# Patient Record
Sex: Male | Born: 1950 | Race: White | Hispanic: No | Marital: Married | Smoking: Former smoker
Health system: Southern US, Community
[De-identification: ages and names within clinical notes are randomized; demographics above are authoritative.]

## PROBLEM LIST (undated history)

## (undated) DIAGNOSIS — C4490 Unspecified malignant neoplasm of skin, unspecified: Secondary | ICD-10-CM

## (undated) DIAGNOSIS — K625 Hemorrhage of anus and rectum: Secondary | ICD-10-CM

## (undated) DIAGNOSIS — Z8371 Family history of colonic polyps: Secondary | ICD-10-CM

## (undated) DIAGNOSIS — N189 Chronic kidney disease, unspecified: Secondary | ICD-10-CM

## (undated) DIAGNOSIS — E785 Hyperlipidemia, unspecified: Secondary | ICD-10-CM

## (undated) DIAGNOSIS — E119 Type 2 diabetes mellitus without complications: Secondary | ICD-10-CM

## (undated) HISTORY — DX: Family history of colonic polyps: Z83.71

## (undated) HISTORY — DX: Chronic kidney disease, unspecified: N18.9

## (undated) HISTORY — DX: Hyperlipidemia, unspecified: E78.5

## (undated) HISTORY — DX: Hemorrhage of anus and rectum: K62.5

## (undated) HISTORY — DX: Unspecified malignant neoplasm of skin, unspecified: C44.90

---

## 2006-05-24 HISTORY — PX: COLONOSCOPY: SHX174

## 2011-07-02 ENCOUNTER — Ambulatory Visit: Payer: Self-pay | Admitting: General Surgery

## 2011-07-05 LAB — PATHOLOGY REPORT

## 2012-12-05 ENCOUNTER — Encounter: Payer: Self-pay | Admitting: *Deleted

## 2014-06-25 ENCOUNTER — Ambulatory Visit: Payer: Self-pay | Admitting: General Surgery

## 2014-07-21 ENCOUNTER — Ambulatory Visit: Payer: Self-pay | Admitting: Physician Assistant

## 2014-07-23 ENCOUNTER — Encounter: Payer: Self-pay | Admitting: *Deleted

## 2014-08-03 ENCOUNTER — Ambulatory Visit: Payer: Self-pay | Admitting: Family Medicine

## 2014-09-11 ENCOUNTER — Ambulatory Visit
Admit: 2014-09-11 | Disposition: A | Discharge status: Home / Self Care | Payer: Self-pay | Attending: Specialist | Admitting: Specialist

## 2016-09-28 ENCOUNTER — Other Ambulatory Visit: Payer: Self-pay | Admitting: Internal Medicine

## 2016-09-28 DIAGNOSIS — R109 Unspecified abdominal pain: Secondary | ICD-10-CM

## 2016-09-28 DIAGNOSIS — R634 Abnormal weight loss: Secondary | ICD-10-CM

## 2016-10-05 ENCOUNTER — Ambulatory Visit
Admission: RE | Admit: 2016-10-05 | Discharge: 2016-10-05 | Disposition: A | Discharge status: Home / Self Care | Payer: Federal, State, Local not specified - PPO | Source: Ambulatory Visit | Attending: Internal Medicine | Admitting: Internal Medicine

## 2016-10-05 DIAGNOSIS — R634 Abnormal weight loss: Secondary | ICD-10-CM

## 2016-10-05 DIAGNOSIS — R109 Unspecified abdominal pain: Secondary | ICD-10-CM | POA: Insufficient documentation

## 2016-10-05 HISTORY — DX: Type 2 diabetes mellitus without complications: E11.9

## 2016-10-05 MED ORDER — IOPAMIDOL (ISOVUE-300) INJECTION 61%
100.0000 mL | Freq: Once | INTRAVENOUS | Status: AC | PRN
  Administered 2016-10-05: 100 mL via INTRAVENOUS

## 2016-10-07 ENCOUNTER — Other Ambulatory Visit: Payer: Self-pay | Admitting: Internal Medicine

## 2016-10-07 DIAGNOSIS — R1907 Generalized intra-abdominal and pelvic swelling, mass and lump: Secondary | ICD-10-CM

## 2016-10-12 ENCOUNTER — Other Ambulatory Visit: Payer: Self-pay | Admitting: General Surgery

## 2016-10-13 ENCOUNTER — Ambulatory Visit: Payer: Federal, State, Local not specified - PPO

## 2016-10-13 ENCOUNTER — Ambulatory Visit
Admission: RE | Admit: 2016-10-13 | Discharge: 2016-10-13 | Disposition: A | Discharge status: Home / Self Care | Payer: Federal, State, Local not specified - PPO | Source: Ambulatory Visit | Attending: Internal Medicine | Admitting: Internal Medicine

## 2016-10-13 DIAGNOSIS — R1907 Generalized intra-abdominal and pelvic swelling, mass and lump: Secondary | ICD-10-CM

## 2016-10-13 MED ORDER — FENTANYL CITRATE (PF) 100 MCG/2ML IJ SOLN
INTRAMUSCULAR | Status: AC
  Filled 2016-10-13: qty 4

## 2016-10-13 MED ORDER — SODIUM CHLORIDE 0.9 % IV SOLN: INTRAVENOUS | Status: DC

## 2016-10-13 MED ORDER — MIDAZOLAM HCL 5 MG/5ML IJ SOLN
INTRAMUSCULAR | Status: AC
  Filled 2016-10-13: qty 5

## 2016-10-15 ENCOUNTER — Other Ambulatory Visit: Payer: Self-pay | Admitting: General Surgery

## 2016-10-19 ENCOUNTER — Ambulatory Visit
Admission: RE | Admit: 2016-10-19 | Discharge: 2016-10-19 | Disposition: A | Discharge status: Home / Self Care | Payer: Federal, State, Local not specified - PPO | Source: Ambulatory Visit | Attending: Internal Medicine | Admitting: Internal Medicine

## 2016-10-19 ENCOUNTER — Ambulatory Visit: Payer: Federal, State, Local not specified - PPO

## 2016-10-19 ENCOUNTER — Other Ambulatory Visit: Payer: Self-pay | Admitting: Internal Medicine

## 2016-10-19 DIAGNOSIS — R634 Abnormal weight loss: Secondary | ICD-10-CM | POA: Insufficient documentation

## 2016-10-19 DIAGNOSIS — E785 Hyperlipidemia, unspecified: Secondary | ICD-10-CM | POA: Insufficient documentation

## 2016-10-19 DIAGNOSIS — R1900 Intra-abdominal and pelvic swelling, mass and lump, unspecified site: Secondary | ICD-10-CM | POA: Diagnosis present

## 2016-10-19 DIAGNOSIS — Z6825 Body mass index (BMI) 25.0-25.9, adult: Secondary | ICD-10-CM | POA: Diagnosis not present

## 2016-10-19 DIAGNOSIS — E119 Type 2 diabetes mellitus without complications: Secondary | ICD-10-CM | POA: Insufficient documentation

## 2016-10-19 DIAGNOSIS — C8510 Unspecified B-cell lymphoma, unspecified site: Secondary | ICD-10-CM | POA: Insufficient documentation

## 2016-10-19 DIAGNOSIS — M1990 Unspecified osteoarthritis, unspecified site: Secondary | ICD-10-CM | POA: Insufficient documentation

## 2016-10-19 DIAGNOSIS — Z87891 Personal history of nicotine dependence: Secondary | ICD-10-CM | POA: Insufficient documentation

## 2016-10-19 DIAGNOSIS — R1907 Generalized intra-abdominal and pelvic swelling, mass and lump: Secondary | ICD-10-CM

## 2016-10-19 LAB — CBC WITH DIFFERENTIAL/PLATELET
Basophils Absolute: 0 10*3/uL (ref 0–0.1)
Basophils Relative: 0 %
EOS ABS: 0.4 10*3/uL (ref 0–0.7)
EOS PCT: 4 %
HEMATOCRIT: 41.2 % (ref 40.0–52.0)
Hemoglobin: 14.1 g/dL (ref 13.0–18.0)
Lymphocytes Relative: 7 %
Lymphs Abs: 0.8 10*3/uL — ABNORMAL LOW (ref 1.0–3.6)
MCH: 30.3 pg (ref 26.0–34.0)
MCHC: 34.3 g/dL (ref 32.0–36.0)
MCV: 88.6 fL (ref 80.0–100.0)
MONO ABS: 1.5 10*3/uL — AB (ref 0.2–1.0)
MONOS PCT: 14 %
Neutro Abs: 7.6 10*3/uL — ABNORMAL HIGH (ref 1.4–6.5)
Neutrophils Relative %: 75 %
Platelets: 185 10*3/uL (ref 150–440)
RBC: 4.65 MIL/uL (ref 4.40–5.90)
RDW: 13.3 % (ref 11.5–14.5)
WBC: 10.3 10*3/uL (ref 3.8–10.6)

## 2016-10-19 LAB — PROTIME-INR
INR: 1.07
PROTHROMBIN TIME: 13.9 s (ref 11.4–15.2)

## 2016-10-19 LAB — APTT: APTT: 27 s (ref 24–36)

## 2016-10-19 MED ORDER — FENTANYL CITRATE (PF) 100 MCG/2ML IJ SOLN
INTRAMUSCULAR | Status: AC | PRN
  Administered 2016-10-19: 50 ug via INTRAVENOUS

## 2016-10-19 MED ORDER — MIDAZOLAM HCL 2 MG/2ML IJ SOLN
INTRAMUSCULAR | Status: AC | PRN
  Administered 2016-10-19: 1 mg via INTRAVENOUS

## 2016-10-19 MED ORDER — SODIUM CHLORIDE 0.9 % IV SOLN
INTRAVENOUS | Status: DC
  Administered 2016-10-19 (×2): via INTRAVENOUS

## 2016-10-19 MED ORDER — OXYCODONE HCL 5 MG PO TABS
10.0000 mg | ORAL_TABLET | Freq: Once | ORAL | Status: AC
  Administered 2016-10-19: 10 mg via ORAL
  Filled 2016-10-19: qty 2

## 2016-10-19 NOTE — Discharge Instructions (Signed)
Needle Biopsy, Care After °Refer to this sheet in the next few weeks. These instructions provide you with information about caring for yourself after your procedure. Your health care provider may also give you more specific instructions. Your treatment has been planned according to current medical practices, but problems sometimes occur. Call your health care provider if you have any problems or questions after your procedure. °What can I expect after the procedure? °After your procedure, it is common to have soreness, bruising, or mild pain at the biopsy site. This should go away in a few days. °Follow these instructions at home: °· Rest as directed by your health care provider. °· Take medicines only as directed by your health care provider. °· There are many different ways to close and cover the biopsy site, including stitches (sutures), skin glue, and adhesive strips. Follow your health care provider's instructions about: °¨ Biopsy site care. °¨ Bandage (dressing) changes and removal. °¨ Biopsy site closure removal. °· Check your biopsy site every day for signs of infection. Watch for: °¨ Redness, swelling, or pain. °¨ Fluid, blood, or pus. °Contact a health care provider if: °· You have a fever. °· You have redness, swelling, or pain at the biopsy site that lasts longer than a few days. °· You have fluid, blood, or pus coming from the biopsy site. °· You feel nauseous. °· You vomit. °Get help right away if: °· You have shortness of breath. °· You have trouble breathing. °· You have chest pain. °· You feel dizzy or you faint. °· You have bleeding that does not stop with pressure or a bandage. °· You cough up blood. °· You have pain in your abdomen. °This information is not intended to replace advice given to you by your health care provider. Make sure you discuss any questions you have with your health care provider. °Document Released: 09/24/2014 Document Revised: 10/16/2015 Document Reviewed:  05/06/2014 °Elsevier Interactive Patient Education © 2017 [employer] ° °

## 2016-10-19 NOTE — Procedures (Signed)
L retroperitoneal mass 18 g core times four EBL 0 Comp 0

## 2016-10-19 NOTE — H&P (Signed)
Chief Complaint: Patient was seen in consultation today for No chief complaint on file.  at the request of Albina Billet  Referring Physician(s): Albina Billet  Supervising Physician: Marybelle Killings  Patient Status: [facility] - Out-pt  History of Present Illness: Gregory Irwin is a 66 y.o. male with a history of diabetes mellitus and seronegative arthritis who presented with a 50 pound weight loss and abdominal pain with bloating. A CT demonstrated a large left retroperitoneal mass invading the left kidney. He is sent for biopsy.  Past Medical History:  Diagnosis Date  . Diabetes mellitus without complication ([place])   . Hemorrhage of rectum and anus   . Hyperlipidemia     Past Surgical History:  Procedure Laterality Date  . COLONOSCOPY  2008    Allergies: Patient has no known allergies.  Medications: Prior to Admission medications   Not on File     No family history on file.  Social History   Social History  . Marital status: Married    Spouse name: N/A  . Number of children: N/A  . Years of education: N/A   Social History Main Topics  . Smoking status: Former Smoker    Packs/day: 1.00    Years: 25.00  . Smokeless tobacco: Never Used  . Alcohol use Yes  . Drug use: No  . Sexual activity: Not on file   Other Topics Concern  . Not on file   Social History Narrative  . No narrative on file      Review of Systems: A 12 point ROS discussed and pertinent positives are indicated in the HPI above.  All other systems are negative.  Review of Systems  Vital Signs: BP 135/80   Pulse (!) 112   Temp 98.1 F (36.7 C) (Oral)   Resp 20   Ht 6\' 2"  (1.88 m)   Wt 200 lb (90.7 kg)   SpO2 98%   BMI 25.68 kg/m   Physical Exam  Constitutional: He is oriented to person, place, and time. He appears well-developed and well-nourished.  Cardiovascular: Normal rate and regular rhythm.   Pulmonary/Chest: Effort normal and breath sounds normal.  Neurological: He  is alert and oriented to person, place, and time.    Mallampati Score:   2  Imaging: Ct Abdomen Pelvis W Contrast  Addendum Date: 10/05/2016   ADDENDUM REPORT: 10/05/2016 16:33 ADDENDUM: Findings were called to Dr. Hall Busing. Dr. Hall Busing will consult Interventional Radiology for a CT guided biopsy of the mass. Electronically Signed   By: Dorise Bullion III M.D   On: 10/05/2016 16:33   Result Date: 10/05/2016 CLINICAL DATA:  Upper abdominal pain for 5 weeks. 40 pounds of weight loss. EXAM: CT ABDOMEN AND PELVIS WITH CONTRAST TECHNIQUE: Multidetector CT imaging of the abdomen and pelvis was performed using the standard protocol following bolus administration of intravenous contrast. CONTRAST:  125mL ISOVUE-300 IOPAMIDOL (ISOVUE-300) INJECTION 61% COMPARISON:  None. FINDINGS: Lower chest: No acute abnormality. Hepatobiliary: Hepatic steatosis identified. No focal liver masses are noted. The gallbladder is normal. The portal vein is patent. Pancreas: The pancreas is poorly evaluated due to a large mass in the abdomen which will be described below. The pancreatic head and neck however appear grossly unremarkable. Spleen: No splenic masses are identified. Adrenals/Urinary Tract: The right adrenal gland is normal. The left adrenal gland is not well evaluated due to the abdominal mass which encases at. The left kidney is encased and invaded by the abnormal soft tissue with mild hydronephrosis  superiorly. The left ureter is encased a as well. The left renal vein is displaced anteriorly. There is a rind of abnormal soft tissue around the right kidney but the right kidney itself is normal in appearance. No right-sided hydronephrosis or perinephric stranding. The right ureter is normal. The bladder is unremarkable. Stomach/Bowel: The large mass in the abdomen abuts the stomach. Findings are suspicious for gastric involvement with apparent thickening of the lesser curvature. The GE junction is encased as well. The proximal  duodenum is also encased. There is no bowel obstruction. The remainder of the small bowel is normal. The colon is normal in appearance. No suspicious mass or obstruction. The appendix is grossly unremarkable. Vascular/Lymphatic: Atherosclerosis is seen in the non aneurysmal aorta and iliac vessels. The abnormal soft tissue mass in the abdomen encases portions of the aorta and some of the arising branches. The celiac and superior mesenteric arteries remain widely patent as does the right renal artery. There is narrowing of the left renal artery which is completely encased. Reproductive: Prostate is unremarkable. Other: There is ascites in the abdomen. There is a large soft tissue mass which is centered in the central abdomen with apparent involvement of the stomach. The mass also encases and invades the left kidney. The mass measures at least 23 x 15 cm on axial image 28. The mass narrows the splenic vein, left renal vein, and left renal artery. There is adenopathy in the left retrocrural region and throughout the retroperitoneum, particularly on the left but also to the right of midline. Abnormal nodes in the gastrohepatic ligament and porta hepatis. Nodes in the pelvis appear to be spared. Musculoskeletal: Sclerosis in the left side of the sacrum on series 3, image 74 is nonspecific and could be a bone island. No definitive bony involvement. IMPRESSION: 1. There is a large mass centered in the upper abdomen with probable involvement of the stomach an definitive involvement of the left kidney. The pancreatic body and tail are also encased. The mass encases multiple artery's and veins with narrowing of the left splenic vein, left renal vein, and left renal artery. There is adenopathy in the retroperitoneum, particularly on the left. There is nodularity in the fat anterior to the liver on image 13 and in an epicardial node on image 15. These findings are not completely specific but lymphoma is considered the most  likely diagnosis. Other primary carcinomas, including pancreatic or renal carcinomas are considered less likely. Recommend correlation with tissue sampling. The tissue may be easiest to sample through the stomach via endoscopic ultrasound. Findings are being called to the referring clinical team. Electronically Signed: By: Dorise Bullion III M.D On: 10/05/2016 16:13    Labs:  CBC:  Recent Labs  10/19/16 0817  WBC 10.3  HGB 14.1  HCT 41.2  PLT 185    COAGS:  Recent Labs  10/19/16 0817  INR 1.07  APTT 27    BMP: No results for input(s): NA, K, CL, CO2, GLUCOSE, BUN, CALCIUM, CREATININE, GFRNONAA, GFRAA in the last 8760 hours.  Invalid input(s): CMP  LIVER FUNCTION TESTS: No results for input(s): BILITOT, AST, ALT, ALKPHOS, PROT, ALBUMIN in the last 8760 hours.  TUMOR MARKERS: No results for input(s): AFPTM, CEA, CA199, CHROMGRNA in the last 8760 hours.  Assessment and Plan:  Abdominal mass. Biopsy to follow.  Thank you for this interesting consult.  I greatly enjoyed meeting [facility] and look forward to participating in their care.  A copy of this report was  sent to the requesting provider on this date.  Electronically Signed: Niyonna Betsill, ART A, MD 10/19/2016, 8:51 AM   I spent a total of  40 Minutes   in face to face in clinical consultation, greater than 50% of which was counseling/coordinating care for biopsy.

## 2016-10-19 NOTE — Progress Notes (Signed)
Patient ready for discharge,denies complaints at this time. Wife present, has had discharge instructions with  Alyson RN, bandade dressing dry/intact to back.

## 2016-10-22 ENCOUNTER — Other Ambulatory Visit: Payer: Self-pay | Admitting: Pathology

## 2016-10-22 ENCOUNTER — Encounter: Payer: Self-pay | Admitting: Oncology

## 2016-10-22 ENCOUNTER — Inpatient Hospital Stay: Payer: Federal, State, Local not specified - PPO | Attending: Oncology | Admitting: Oncology

## 2016-10-22 VITALS — BP 101/64 | HR 96 | Temp 96.6°F | Resp 20 | Ht 75.0 in | Wt 206.6 lb

## 2016-10-22 DIAGNOSIS — E1122 Type 2 diabetes mellitus with diabetic chronic kidney disease: Secondary | ICD-10-CM | POA: Diagnosis not present

## 2016-10-22 DIAGNOSIS — C8330 Diffuse large B-cell lymphoma, unspecified site: Secondary | ICD-10-CM

## 2016-10-22 DIAGNOSIS — R1010 Upper abdominal pain, unspecified: Secondary | ICD-10-CM | POA: Diagnosis not present

## 2016-10-22 DIAGNOSIS — Z800 Family history of malignant neoplasm of digestive organs: Secondary | ICD-10-CM | POA: Diagnosis not present

## 2016-10-22 DIAGNOSIS — R5381 Other malaise: Secondary | ICD-10-CM

## 2016-10-22 DIAGNOSIS — G4700 Insomnia, unspecified: Secondary | ICD-10-CM | POA: Diagnosis not present

## 2016-10-22 DIAGNOSIS — R5383 Other fatigue: Secondary | ICD-10-CM | POA: Diagnosis not present

## 2016-10-22 DIAGNOSIS — Z79899 Other long term (current) drug therapy: Secondary | ICD-10-CM | POA: Diagnosis not present

## 2016-10-22 DIAGNOSIS — R634 Abnormal weight loss: Secondary | ICD-10-CM

## 2016-10-22 DIAGNOSIS — N1330 Unspecified hydronephrosis: Secondary | ICD-10-CM | POA: Diagnosis not present

## 2016-10-22 DIAGNOSIS — Z87891 Personal history of nicotine dependence: Secondary | ICD-10-CM

## 2016-10-22 DIAGNOSIS — R531 Weakness: Secondary | ICD-10-CM | POA: Diagnosis not present

## 2016-10-22 DIAGNOSIS — I2510 Atherosclerotic heart disease of native coronary artery without angina pectoris: Secondary | ICD-10-CM | POA: Diagnosis not present

## 2016-10-22 DIAGNOSIS — K5900 Constipation, unspecified: Secondary | ICD-10-CM | POA: Diagnosis not present

## 2016-10-22 DIAGNOSIS — N189 Chronic kidney disease, unspecified: Secondary | ICD-10-CM | POA: Diagnosis not present

## 2016-10-22 DIAGNOSIS — E785 Hyperlipidemia, unspecified: Secondary | ICD-10-CM

## 2016-10-22 DIAGNOSIS — R1900 Intra-abdominal and pelvic swelling, mass and lump, unspecified site: Secondary | ICD-10-CM | POA: Diagnosis not present

## 2016-10-22 DIAGNOSIS — R188 Other ascites: Secondary | ICD-10-CM

## 2016-10-22 DIAGNOSIS — Z85828 Personal history of other malignant neoplasm of skin: Secondary | ICD-10-CM

## 2016-10-22 MED ORDER — PROCHLORPERAZINE MALEATE 10 MG PO TABS: 10.0000 mg | ORAL_TABLET | Freq: Four times a day (QID) | ORAL | 6 refills | Status: DC | PRN

## 2016-10-22 MED ORDER — PREDNISONE 20 MG PO TABS
100.0000 mg | ORAL_TABLET | Freq: Every day | ORAL | 6 refills | Status: DC
Start: 2016-10-22 — End: 2016-11-27

## 2016-10-22 MED ORDER — LIDOCAINE-PRILOCAINE 2.5-2.5 % EX CREA: TOPICAL_CREAM | CUTANEOUS | 3 refills | Status: DC

## 2016-10-22 MED ORDER — ALLOPURINOL 300 MG PO TABS
300.0000 mg | ORAL_TABLET | Freq: Every day | ORAL | 3 refills | Status: DC
Start: 2016-10-22 — End: 2016-11-27

## 2016-10-22 MED ORDER — ONDANSETRON HCL 8 MG PO TABS: 8.0000 mg | ORAL_TABLET | Freq: Two times a day (BID) | ORAL | 2 refills | Status: DC | PRN

## 2016-10-22 NOTE — Progress Notes (Signed)
Here  for new pt eval  

## 2016-10-22 NOTE — Progress Notes (Signed)
Gregory Irwin  [phone] [phone]) [phone]  ID: Gregory Irwin OB: 05/19/1951  MR#: [id number]  EKC#:[id number]  Patient Care Team: Albina Billet, MD as PCP - General (Internal Medicine)  CHIEF COMPLAINT: Large B-cell lymphoma.  INTERVAL HISTORY: Patient is a 66 year old male who started having abdominal pain and weight loss approximately 2 months ago. Subsequent CT scan and biopsy revealed a large B-cell lymphoma. He has persistent weakness and fatigue and a poor appetite. He has continued abdominal pain and occasional insomnia. He has no neurologic complaints. He denies any fevers or night sweats. He denies any chest pain or shortness of breath. He denies any nausea, vomiting, or diarrhea. He does admit to occasional constipation. He has no urinary complaints. Patient offers no further specific complaints. Although  REVIEW OF SYSTEMS:   Review of Systems  Constitutional: Positive for malaise/fatigue and weight loss. Negative for diaphoresis and fever.  Respiratory: Negative.  Negative for cough and shortness of breath.   Cardiovascular: Negative.  Negative for chest pain and leg swelling.  Gastrointestinal: Positive for abdominal pain, constipation and heartburn. Negative for blood in stool, melena, nausea and vomiting.  Genitourinary: Negative.   Musculoskeletal: Negative.   Skin: Negative.  Negative for rash.  Neurological: Positive for weakness. Negative for sensory change.  Psychiatric/Behavioral: The patient is nervous/anxious.     As per HPI. Otherwise, a complete review of systems is negative.  PAST MEDICAL HISTORY: Past Medical History:  Diagnosis Date  . Chronic kidney disease    lisinoprol given  . Diabetes mellitus without complication ([place])   . FH: colon polyps   . Hemorrhage of rectum and anus   . Hyperlipidemia   . Skin cancer     PAST SURGICAL HISTORY: Past Surgical History:  Procedure Laterality Date  . COLONOSCOPY   2008    FAMILY HISTORY: Family History  Problem Relation Age of Onset  . Aortic aneurysm Mother   . Coronary artery disease Father   . Heart attack Father   . Diabetes Brother   . COPD Brother   . Renal Disease Brother   . Diabetes Brother   . Diabetes Brother   . Liver disease Brother   . Coronary artery disease Brother   . Diabetes Brother   . Diabetes Brother   . Pancreatic cancer Sister     ADVANCED DIRECTIVES (Y/N):  N  HEALTH MAINTENANCE: Social History  Substance Use Topics  . Smoking status: Former Smoker    Packs/day: 1.00    Years: 25.00    Quit date: 10/23/1975  . Smokeless tobacco: Current User    Types: Chew  . Alcohol use Yes     Colonoscopy:  PAP:  Bone density:  Lipid panel:  No Known Allergies  Current Outpatient Prescriptions  Medication Sig Dispense Refill  . Lido-Capsaicin-Men-Methyl Sal (1ST MEDX-PATCH/ LIDOCAINE) 4-0.373-10-10 % PTCH Apply topically.    Marland Kitchen oxycodone (OXY-IR) 5 MG capsule Take 10 mg by mouth 2 (two) times daily.    . Cinnamon 500 MG capsule Take by mouth.    . Lancets Misc. (ACCU-CHEK FASTCLIX LANCET) KIT Accu-Chek FastClix    . lisinopril (PRINIVIL,ZESTRIL) 2.5 MG tablet Take 2.5 mg by mouth daily.  4   No current facility-administered medications for this visit.     OBJECTIVE: Vitals:   10/22/16 1033 10/22/16 1037  BP: 101/64 101/64  Pulse: 96 96  Resp: 18 20  Temp: (!) 96.6 F (35.9 C) (!) 96.6 F (35.9 C)  Body mass index is 25.82 kg/m.    ECOG FS:1 - Symptomatic but completely ambulatory  General: Well-developed, well-nourished, no acute distress. Eyes: Pink conjunctiva, anicteric sclera. HEENT: Normocephalic, moist mucous membranes, clear oropharnyx. Lungs: Clear to auscultation bilaterally. Heart: Regular rate and rhythm. No rubs, murmurs, or gallops. Abdomen: Soft, nontender, nondistended. No organomegaly noted, normoactive bowel sounds. Musculoskeletal: No edema, cyanosis, or clubbing. Neuro: Alert,  answering all questions appropriately. Cranial nerves grossly intact. Skin: No rashes or petechiae noted. Psych: Normal affect. Lymphatics: No cervical, calvicular, axillary or inguinal LAD.   LAB RESULTS:  No results found for: NA, K, CL, CO2, GLUCOSE, BUN, CREATININE, CALCIUM, PROT, ALBUMIN, AST, ALT, ALKPHOS, BILITOT, GFRNONAA, GFRAA  Lab Results  Component Value Date   WBC 10.3 10/19/2016   NEUTROABS 7.6 (H) 10/19/2016   HGB 14.1 10/19/2016   HCT 41.2 10/19/2016   MCV 88.6 10/19/2016   PLT 185 10/19/2016     STUDIES: Ct Abdomen Pelvis W Contrast  Addendum Date: 10/05/2016   ADDENDUM REPORT: 10/05/2016 16:33 ADDENDUM: Findings were called to Dr. Hall Busing. Dr. Hall Busing will consult Interventional Radiology for a CT guided biopsy of the mass. Electronically Signed   By: Dorise Bullion III M.D   On: 10/05/2016 16:33   Result Date: 10/05/2016 CLINICAL DATA:  Upper abdominal pain for 5 weeks. 40 pounds of weight loss. EXAM: CT ABDOMEN AND PELVIS WITH CONTRAST TECHNIQUE: Multidetector CT imaging of the abdomen and pelvis was performed using the standard protocol following bolus administration of intravenous contrast. CONTRAST:  175m ISOVUE-300 IOPAMIDOL (ISOVUE-300) INJECTION 61% COMPARISON:  None. FINDINGS: Lower chest: No acute abnormality. Hepatobiliary: Hepatic steatosis identified. No focal liver masses are noted. The gallbladder is normal. The portal vein is patent. Pancreas: The pancreas is poorly evaluated due to a large mass in the abdomen which will be described below. The pancreatic head and neck however appear grossly unremarkable. Spleen: No splenic masses are identified. Adrenals/Urinary Tract: The right adrenal gland is normal. The left adrenal gland is not well evaluated due to the abdominal mass which encases at. The left kidney is encased and invaded by the abnormal soft tissue with mild hydronephrosis superiorly. The left ureter is encased a as well. The left renal vein is  displaced anteriorly. There is a rind of abnormal soft tissue around the right kidney but the right kidney itself is normal in appearance. No right-sided hydronephrosis or perinephric stranding. The right ureter is normal. The bladder is unremarkable. Stomach/Bowel: The large mass in the abdomen abuts the stomach. Findings are suspicious for gastric involvement with apparent thickening of the lesser curvature. The GE junction is encased as well. The proximal duodenum is also encased. There is no bowel obstruction. The remainder of the small bowel is normal. The colon is normal in appearance. No suspicious mass or obstruction. The appendix is grossly unremarkable. Vascular/Lymphatic: Atherosclerosis is seen in the non aneurysmal aorta and iliac vessels. The abnormal soft tissue mass in the abdomen encases portions of the aorta and some of the arising branches. The celiac and superior mesenteric arteries remain widely patent as does the right renal artery. There is narrowing of the left renal artery which is completely encased. Reproductive: Prostate is unremarkable. Other: There is ascites in the abdomen. There is a large soft tissue mass which is centered in the central abdomen with apparent involvement of the stomach. The mass also encases and invades the left kidney. The mass measures at least 23 x 15 cm on axial image 28. The mass  narrows the splenic vein, left renal vein, and left renal artery. There is adenopathy in the left retrocrural region and throughout the retroperitoneum, particularly on the left but also to the right of midline. Abnormal nodes in the gastrohepatic ligament and porta hepatis. Nodes in the pelvis appear to be spared. Musculoskeletal: Sclerosis in the left side of the sacrum on series 3, image 74 is nonspecific and could be a bone island. No definitive bony involvement. IMPRESSION: 1. There is a large mass centered in the upper abdomen with probable involvement of the stomach an definitive  involvement of the left kidney. The pancreatic body and tail are also encased. The mass encases multiple artery's and veins with narrowing of the left splenic vein, left renal vein, and left renal artery. There is adenopathy in the retroperitoneum, particularly on the left. There is nodularity in the fat anterior to the liver on image 13 and in an epicardial node on image 15. These findings are not completely specific but lymphoma is considered the most likely diagnosis. Other primary carcinomas, including pancreatic or renal carcinomas are considered less likely. Recommend correlation with tissue sampling. The tissue may be easiest to sample through the stomach via endoscopic ultrasound. Findings are being called to the referring clinical team. Electronically Signed: By: Dorise Bullion III M.D On: 10/05/2016 16:13   Ct Biopsy  Result Date: 10/19/2016 INDICATION: Large left retroperitoneal mass. EXAM: CT BIOPSY MEDICATIONS: None ANESTHESIA/SEDATION: Fentanyl 50 mcg IV; Versed 1 mg IV Moderate Sedation Time:  10 The patient was continuously monitored during the procedure by the interventional radiology nurse under my direct supervision. FLUOROSCOPY TIME:  None COMPLICATIONS: None immediate. PROCEDURE: Informed written consent was obtained from the patient after a thorough discussion of the procedural risks, benefits and alternatives. All questions were addressed. Maximal Sterile Barrier Technique was utilized including caps, mask, sterile gowns, sterile gloves, sterile drape, hand hygiene and skin antiseptic. A timeout was performed prior to the initiation of the procedure. Under CT guidance, a(n) 17 gauge guide needle was advanced into the left retroperitoneal mass. Subsequently four 18 gauge core biopsies were obtained. The guide needle was removed. Post biopsy images demonstrate no hemorrhage. Patient tolerated the procedure well without complication. Vital sign monitoring by nursing staff during the  procedure will continue as patient is in the special procedures unit for post procedure observation. FINDINGS: The images document guide needle placement within the left retroperitoneal mass. Post biopsy images demonstrate no hemorrhage. IMPRESSION: Successful CT-guided core biopsy of a left retroperitoneal mass. Electronically Signed   By: Marybelle Killings M.D.   On: 10/19/2016 10:50   Ct Biopsy  Result Date: 10/19/2016 INDICATION: Large left retroperitoneal mass. EXAM: CT BIOPSY MEDICATIONS: None ANESTHESIA/SEDATION: Fentanyl 50 mcg IV; Versed 1 mg IV Moderate Sedation Time:  10 The patient was continuously monitored during the procedure by the interventional radiology nurse under my direct supervision. FLUOROSCOPY TIME:  None COMPLICATIONS: None immediate. PROCEDURE: Informed written consent was obtained from the patient after a thorough discussion of the procedural risks, benefits and alternatives. All questions were addressed. Maximal Sterile Barrier Technique was utilized including caps, mask, sterile gowns, sterile gloves, sterile drape, hand hygiene and skin antiseptic. A timeout was performed prior to the initiation of the procedure. Under CT guidance, a(n) 17 gauge guide needle was advanced into the left retroperitoneal mass. Subsequently four 18 gauge core biopsies were obtained. The guide needle was removed. Post biopsy images demonstrate no hemorrhage. Patient tolerated the procedure well without complication. Vital sign monitoring  by nursing staff during the procedure will continue as patient is in the special procedures unit for post procedure observation. FINDINGS: The images document guide needle placement within the left retroperitoneal mass. Post biopsy images demonstrate no hemorrhage. IMPRESSION: Successful CT-guided core biopsy of a left retroperitoneal mass. Electronically Signed   By: Marybelle Killings M.D.   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 Large B-cell lymphoma: Final pathology results are pending, but this is likely a diffuse large B-cell lymphoma. CT scan results reviewed independently and reported as above. Patient will require bone marrow biopsy and PET scan to complete the staging workup. He will also require a port placement as well as MUGA scan prior to initiating treatment with R-CHOP chemotherapy. Patient will also have chemotherapy teaching class next week. Plan on giving treatment every 21 days with repeat imaging after 4 cycles. Return to clinic on November 02, 2016 to initiate cycle 1 of 6 of R-CHOP. 2. Weight loss: Secondary to lymphoma, monitor and consider dietary consultation in the future. 3. Pain: Patient was instructed to increase his oxycodone dose to 4 times daily as needed. 4. Constipation: Continue OTC treatments as indicated.  Approximately 60 minutes was spent in discussion of which greater than 50% was consultation. I will  Patient expressed understanding and was in agreement with this plan. He also understands that He can call clinic at any time with any questions, concerns, or complaints.   Cancer Staging Diffuse large B-cell lymphoma ([place]) Staging form: Hodgkin and Non-Hodgkin Lymphoma, AJCC 8th Edition - Clinical stage from 10/22/2016: Stage II bulky (Diffuse large B-cell lymphoma) - Signed by Lloyd Huger, MD on 10/22/2016   Lloyd Huger, MD   10/22/2016 12:53 PM

## 2016-10-22 NOTE — Progress Notes (Signed)
START ON PATHWAY REGIMEN - Lymphoma and CLL     A cycle is every 21 days:     Rituximab      Cyclophosphamide      Doxorubicin      Vincristine      Prednisone   **Always confirm dose/schedule in your pharmacy ordering system**    Patient Characteristics: Diffuse Large B Cell Lymphoma, First Line, Stage I and II, Bulky Disease (10 cm or > 1/3 Diameter of Chest) Disease Type: Not Applicable Disease Type: Diffuse Large B Cell Line of therapy: First Line [place] Stage: IIE Disease Characteristics: Bulky Disease  Intent of Therapy: Curative Intent, Discussed with Patient

## 2016-10-25 ENCOUNTER — Other Ambulatory Visit: Payer: Self-pay

## 2016-10-25 ENCOUNTER — Telehealth: Payer: Self-pay | Admitting: *Deleted

## 2016-10-25 ENCOUNTER — Emergency Department: Payer: Medicare Other

## 2016-10-25 ENCOUNTER — Other Ambulatory Visit: Payer: Self-pay | Admitting: Oncology

## 2016-10-25 ENCOUNTER — Inpatient Hospital Stay
Admission: EM | Admit: 2016-10-25 | Discharge: 2016-11-21 | DRG: 208 | Disposition: E | Payer: Medicare Other | Attending: Internal Medicine | Admitting: Internal Medicine

## 2016-10-25 DIAGNOSIS — Z79891 Long term (current) use of opiate analgesic: Secondary | ICD-10-CM

## 2016-10-25 DIAGNOSIS — R40243 Glasgow coma scale score 3-8, unspecified time: Secondary | ICD-10-CM | POA: Diagnosis present

## 2016-10-25 DIAGNOSIS — J9601 Acute respiratory failure with hypoxia: Secondary | ICD-10-CM | POA: Diagnosis present

## 2016-10-25 DIAGNOSIS — E872 Acidosis: Secondary | ICD-10-CM | POA: Diagnosis present

## 2016-10-25 DIAGNOSIS — I2699 Other pulmonary embolism without acute cor pulmonale: Secondary | ICD-10-CM | POA: Diagnosis present

## 2016-10-25 DIAGNOSIS — C8510 Unspecified B-cell lymphoma, unspecified site: Secondary | ICD-10-CM | POA: Diagnosis present

## 2016-10-25 DIAGNOSIS — F17220 Nicotine dependence, chewing tobacco, uncomplicated: Secondary | ICD-10-CM | POA: Diagnosis present

## 2016-10-25 DIAGNOSIS — Z800 Family history of malignant neoplasm of digestive organs: Secondary | ICD-10-CM | POA: Diagnosis not present

## 2016-10-25 DIAGNOSIS — R001 Bradycardia, unspecified: Secondary | ICD-10-CM | POA: Diagnosis present

## 2016-10-25 DIAGNOSIS — Z79899 Other long term (current) drug therapy: Secondary | ICD-10-CM

## 2016-10-25 DIAGNOSIS — N189 Chronic kidney disease, unspecified: Secondary | ICD-10-CM | POA: Diagnosis present

## 2016-10-25 DIAGNOSIS — Z825 Family history of asthma and other chronic lower respiratory diseases: Secondary | ICD-10-CM

## 2016-10-25 DIAGNOSIS — Z833 Family history of diabetes mellitus: Secondary | ICD-10-CM

## 2016-10-25 DIAGNOSIS — Z8249 Family history of ischemic heart disease and other diseases of the circulatory system: Secondary | ICD-10-CM | POA: Diagnosis not present

## 2016-10-25 DIAGNOSIS — I468 Cardiac arrest due to other underlying condition: Secondary | ICD-10-CM | POA: Diagnosis present

## 2016-10-25 DIAGNOSIS — Z841 Family history of disorders of kidney and ureter: Secondary | ICD-10-CM

## 2016-10-25 DIAGNOSIS — J9690 Respiratory failure, unspecified, unspecified whether with hypoxia or hypercapnia: Secondary | ICD-10-CM

## 2016-10-25 DIAGNOSIS — I4510 Unspecified right bundle-branch block: Secondary | ICD-10-CM | POA: Diagnosis present

## 2016-10-25 DIAGNOSIS — I472 Ventricular tachycardia: Secondary | ICD-10-CM | POA: Diagnosis present

## 2016-10-25 DIAGNOSIS — R230 Cyanosis: Secondary | ICD-10-CM | POA: Diagnosis present

## 2016-10-25 DIAGNOSIS — C8330 Diffuse large B-cell lymphoma, unspecified site: Secondary | ICD-10-CM

## 2016-10-25 DIAGNOSIS — Z7984 Long term (current) use of oral hypoglycemic drugs: Secondary | ICD-10-CM | POA: Diagnosis not present

## 2016-10-25 DIAGNOSIS — E785 Hyperlipidemia, unspecified: Secondary | ICD-10-CM | POA: Diagnosis present

## 2016-10-25 DIAGNOSIS — I959 Hypotension, unspecified: Secondary | ICD-10-CM | POA: Diagnosis present

## 2016-10-25 DIAGNOSIS — N179 Acute kidney failure, unspecified: Secondary | ICD-10-CM | POA: Diagnosis present

## 2016-10-25 DIAGNOSIS — I469 Cardiac arrest, cause unspecified: Secondary | ICD-10-CM

## 2016-10-25 DIAGNOSIS — E1122 Type 2 diabetes mellitus with diabetic chronic kidney disease: Secondary | ICD-10-CM | POA: Diagnosis present

## 2016-10-25 DIAGNOSIS — Z66 Do not resuscitate: Secondary | ICD-10-CM | POA: Diagnosis present

## 2016-10-25 LAB — CBC WITH DIFFERENTIAL/PLATELET
Basophils Absolute: 0.1 10*3/uL (ref 0–0.1)
Basophils Relative: 1 %
Eosinophils Absolute: 0.3 10*3/uL (ref 0–0.7)
Eosinophils Relative: 3 %
HEMATOCRIT: 40.7 % (ref 40.0–52.0)
Hemoglobin: 12.9 g/dL — ABNORMAL LOW (ref 13.0–18.0)
LYMPHS ABS: 2.2 10*3/uL (ref 1.0–3.6)
Lymphocytes Relative: 18 %
MCH: 30.7 pg (ref 26.0–34.0)
MCHC: 31.7 g/dL — AB (ref 32.0–36.0)
MCV: 96.9 fL (ref 80.0–100.0)
MONOS PCT: 8 %
Monocytes Absolute: 1 10*3/uL (ref 0.2–1.0)
NEUTROS ABS: 8.3 10*3/uL — AB (ref 1.4–6.5)
NEUTROS PCT: 70 %
Platelets: 85 10*3/uL — ABNORMAL LOW (ref 150–440)
RBC: 4.2 MIL/uL — AB (ref 4.40–5.90)
RDW: 14.1 % (ref 11.5–14.5)
WBC: 11.9 10*3/uL — AB (ref 3.8–10.6)

## 2016-10-25 LAB — COMPREHENSIVE METABOLIC PANEL
ALT: 49 U/L (ref 17–63)
AST: 132 U/L — ABNORMAL HIGH (ref 15–41)
Albumin: 2.4 g/dL — ABNORMAL LOW (ref 3.5–5.0)
Alkaline Phosphatase: 78 U/L (ref 38–126)
Anion gap: 20 — ABNORMAL HIGH (ref 5–15)
BUN: 29 mg/dL — ABNORMAL HIGH (ref 6–20)
CALCIUM: 12.3 mg/dL — AB (ref 8.9–10.3)
CO2: 20 mmol/L — AB (ref 22–32)
CREATININE: 1.89 mg/dL — AB (ref 0.61–1.24)
Chloride: 97 mmol/L — ABNORMAL LOW (ref 101–111)
GFR calc non Af Amer: 36 mL/min — ABNORMAL LOW (ref >60)
GFR, EST AFRICAN AMERICAN: 41 mL/min — AB (ref >60)
Glucose, Bld: 284 mg/dL — ABNORMAL HIGH (ref 65–99)
Potassium: 5.4 mmol/L — ABNORMAL HIGH (ref 3.5–5.1)
SODIUM: 137 mmol/L (ref 135–145)
TOTAL PROTEIN: 4.9 g/dL — AB (ref 6.5–8.1)
Total Bilirubin: 0.5 mg/dL (ref 0.3–1.2)

## 2016-10-25 LAB — TROPONIN I: Troponin I: 0.07 ng/mL (ref <0.03)

## 2016-10-25 MED ORDER — IOPAMIDOL (ISOVUE-370) INJECTION 76%
75.0000 mL | Freq: Once | INTRAVENOUS | Status: AC | PRN
Start: 2016-10-25 — End: 2016-10-25
  Administered 2016-10-25: 75 mL via INTRAVENOUS

## 2016-10-25 MED ORDER — VANCOMYCIN HCL IN DEXTROSE 1-5 GM/200ML-% IV SOLN: 1000.0000 mg | INTRAVENOUS | Status: DC

## 2016-10-25 MED ORDER — ALTEPLASE (PULMONARY EMBOLISM) INFUSION
100.0000 mg | Freq: Once | INTRAVENOUS | Status: AC
  Administered 2016-10-25: 100 mg via INTRAVENOUS
  Filled 2016-10-25: qty 100

## 2016-10-25 MED ORDER — ALTEPLASE 100 MG IV SOLR
INTRAVENOUS | Status: AC
  Filled 2016-10-25: qty 100

## 2016-10-25 MED ORDER — ROCURONIUM BROMIDE 50 MG/5ML IV SOLN
INTRAVENOUS | Status: AC | PRN
  Administered 2016-10-25: 100 mg via INTRAVENOUS

## 2016-10-25 MED ORDER — ATROPINE SULFATE 1 MG/ML IJ SOLN
INTRAMUSCULAR | Status: AC | PRN
  Administered 2016-10-25 (×3): 1 mg via INTRAVENOUS

## 2016-10-25 MED ORDER — EPINEPHRINE PF 1 MG/10ML IJ SOSY
PREFILLED_SYRINGE | INTRAMUSCULAR | Status: AC | PRN
  Administered 2016-10-25 (×2): 1 mg via INTRAVENOUS

## 2016-10-25 MED ORDER — CEFEPIME-DEXTROSE 1 GM/50ML IV SOLR
1.0000 g | Freq: Once | INTRAVENOUS | Status: AC
  Administered 2016-10-25: 1 g via INTRAVENOUS
  Filled 2016-10-25: qty 50

## 2016-10-25 MED ORDER — EPINEPHRINE PF 1 MG/10ML IJ SOSY
PREFILLED_SYRINGE | INTRAMUSCULAR | Status: AC | PRN
  Administered 2016-10-25: 1 mg via INTRAVENOUS

## 2016-10-25 MED ORDER — NOREPINEPHRINE BITARTRATE 1 MG/ML IV SOLN
0.0000 ug/min | INTRAVENOUS | Status: DC
  Administered 2016-10-25: 10 ug/min via INTRAVENOUS
  Filled 2016-10-25: qty 4

## 2016-10-25 MED ORDER — VANCOMYCIN HCL IN DEXTROSE 1-5 GM/200ML-% IV SOLN
1000.0000 mg | Freq: Once | INTRAVENOUS | Status: AC
  Administered 2016-10-25: 1000 mg via INTRAVENOUS
  Filled 2016-10-25: qty 200

## 2016-10-25 MED ORDER — SODIUM BICARBONATE 8.4 % IV SOLN
INTRAVENOUS | Status: AC | PRN
  Administered 2016-10-25: 50 meq via INTRAVENOUS

## 2016-10-25 MED ORDER — SODIUM BICARBONATE 8.4 % IV SOLN
INTRAVENOUS | Status: AC | PRN
  Administered 2016-10-25 (×3): 50 meq via INTRAVENOUS

## 2016-10-25 MED ORDER — SODIUM CHLORIDE 0.9 % IV BOLUS (SEPSIS): 500.0000 mL | Freq: Once | INTRAVENOUS | Status: DC

## 2016-10-25 MED ORDER — VASOPRESSIN 20 UNIT/ML IV SOLN
0.0300 [IU]/min | INTRAVENOUS | Status: DC
  Filled 2016-10-25: qty 2

## 2016-10-25 MED ORDER — EPINEPHRINE PF 1 MG/10ML IJ SOSY
PREFILLED_SYRINGE | INTRAMUSCULAR | Status: AC | PRN
  Administered 2016-10-25: 10 mL via INTRAVENOUS
  Administered 2016-10-25: 1 mg via INTRAVENOUS

## 2016-10-26 ENCOUNTER — Encounter: Admission: RE | Admit: 2016-10-26 | Payer: Federal, State, Local not specified - PPO | Source: Ambulatory Visit

## 2016-10-26 ENCOUNTER — Inpatient Hospital Stay: Payer: Federal, State, Local not specified - PPO

## 2016-10-26 MED FILL — Medication: Qty: 1 | Status: AC

## 2016-11-01 ENCOUNTER — Ambulatory Visit: Payer: Federal, State, Local not specified - PPO

## 2016-11-04 ENCOUNTER — Encounter: Payer: Self-pay | Admitting: Oncology

## 2016-11-04 ENCOUNTER — Other Ambulatory Visit: Payer: Federal, State, Local not specified - PPO

## 2016-11-04 ENCOUNTER — Ambulatory Visit: Payer: Federal, State, Local not specified - PPO

## 2016-11-04 ENCOUNTER — Ambulatory Visit: Payer: Federal, State, Local not specified - PPO | Admitting: Oncology

## 2016-11-04 LAB — SURGICAL PATHOLOGY

## 2016-11-05 ENCOUNTER — Encounter: Payer: Self-pay | Admitting: Oncology

## 2016-11-21 NOTE — Code Documentation (Signed)
CPR restarted.

## 2016-11-21 NOTE — Code Documentation (Signed)
Pt wife and daughter refused vasopressin

## 2016-11-21 NOTE — Code Documentation (Signed)
Right fem line placed by Dr Quentin Cornwall

## 2016-11-21 NOTE — ED Notes (Signed)
Family in with pt at this time 

## 2016-11-21 NOTE — Code Documentation (Signed)
TPA started.  

## 2016-11-21 NOTE — ED Notes (Signed)
Done at 1450

## 2016-11-21 NOTE — Telephone Encounter (Signed)
Has questions regarding the Allopurinol. Please return her call

## 2016-11-21 NOTE — Code Documentation (Signed)
Pt arrived via ems with cpr in progress - pt reported to have been home and had a seizure then went into cardiac arrest that was witnessed by family - ems arrived and pt was in vtach with a pulse - he was shocked a 200 and pulse returned - pt went back into vtach with pulse and then pulses lost and cpr resumed - per ems pt was given 13 epi in the field and 1 amp of bicarb - pt alert enough to bite king airway so no airway in place on arrival

## 2016-11-21 NOTE — Code Documentation (Signed)
cpr stopped - pulse obtained

## 2016-11-21 NOTE — Discharge Summary (Signed)
Patient was admitted with massive pulmonary embolism and status post cardiac arrest.  He had almost 40 minutes of CPR and again cardiac arrest during CT scan in ER. Family, after discussing with intensivist- agreed on DO NOT RESUSCITATE. Patient had cardiac arrest again and he died. Cause of death- pulmonary emboli         Other contradicting factors- B-cell lymphoma and diabetes.

## 2016-11-21 NOTE — Code Documentation (Signed)
No femoral pulse felt - no heart tones heard - Dr Quentin Cornwall called to room

## 2016-11-21 NOTE — ED Provider Notes (Signed)
[facility] Emergency Department Provider Note    None    (approximate)  I have reviewed the triage vital signs and the nursing notes.   HISTORY  Chief Complaint Cardiac arrest  Level V Caveat:    HPI Gregory Irwin is a 66 y.o. male with reported history of B-cell lymphoma who presents with witnessed cardiac arrest. Patient brought in by EMS unresponsive with CPR in progress. Bag-valve-mask for respiratory support. Initial call was for seizure-like activity. When fire and rescue arrived the patient had a witnessed seizure-like episode. Then had roughly 15-20 minutes of early high-quality CPR performed by daughter who is a ICU nurse. There was pulseless V. tach that was shocked min and subsequent PE arrest. He had several episodes of return of spontaneous circulation. Epi was given. No amiodarone. Bicarbonate was given.  Initial EKG and feel that showed evidence of inferior ST elevation MI.   Past Medical History:  Diagnosis Date  . Chronic kidney disease    lisinoprol given  . Diabetes mellitus without complication ([place])   . FH: colon polyps   . Hemorrhage of rectum and anus   . Hyperlipidemia   . Skin cancer    Family History  Problem Relation Age of Onset  . Aortic aneurysm Mother   . Coronary artery disease Father   . Heart attack Father   . Diabetes Brother   . COPD Brother   . Renal Disease Brother   . Diabetes Brother   . Diabetes Brother   . Liver disease Brother   . Coronary artery disease Brother   . Diabetes Brother   . Diabetes Brother   . Pancreatic cancer Sister    Past Surgical History:  Procedure Laterality Date  . COLONOSCOPY  2008   Patient Active Problem List   Diagnosis Date Noted  . Cardiac arrest ([place]) 08-Nov-2016  . Pulmonary emboli ([place]) 11/08/16  . 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 Authorizing Provider    allopurinol (ZYLOPRIM) 300 MG tablet Take 1 tablet (300 mg total) by mouth daily. 10/22/16  Yes Lloyd Huger, MD  Cinnamon 500 MG capsule Take by mouth.   Yes [provider]  lisinopril (PRINIVIL,ZESTRIL) 2.5 MG tablet Take 2.5 mg by mouth daily. 09/03/16  Yes [provider]  metFORMIN (GLUCOPHAGE) 500 MG tablet Take 500 mg by mouth 2 (two) times daily with a meal.   Yes [provider]  ondansetron (ZOFRAN) 8 MG tablet Take 1 tablet (8 mg total) by mouth 2 (two) times daily as needed for refractory nausea / vomiting. Start on day 3 after cyclophosphamide chemotherapy. 10/22/16  Yes Lloyd Huger, MD  Oxycodone HCl 10 MG TABS Take 10 mg by mouth 2 (two) times daily. 10/21/16  Yes [provider]  predniSONE (DELTASONE) 20 MG tablet Take 5 tablets (100 mg total) by mouth daily. Take on days 1-5 of chemotherapy. 10/22/16  Yes Lloyd Huger, MD  prochlorperazine (COMPAZINE) 10 MG tablet Take 1 tablet (10 mg total) by mouth every 6 (six) hours as needed (Nausea or vomiting). 10/22/16  Yes Lloyd Huger, MD  Lancets Misc. (ACCU-CHEK FASTCLIX LANCET) KIT Accu-Chek FastClix    [provider]  Lido-Capsaicin-Men-Methyl Sal (1ST MEDX-PATCH/ LIDOCAINE) 4-0.373-10-10 % PTCH Apply topically.    [provider]  lidocaine-prilocaine (EMLA) cream Apply to affected area once 10/22/16   Lloyd Huger,  MD  oxycodone (OXY-IR) 5 MG capsule Take 10 mg by mouth 2 (two) times daily.    [provider]    Allergies Patient has no known allergies.    Social History Social History  Substance Use Topics  . Smoking status: Former Smoker    Packs/day: 1.00    Years: 25.00    Quit date: 10/23/1975  . Smokeless tobacco: Current User    Types: Chew  . Alcohol use Yes    Review of Systems Patient denies headaches, rhinorrhea, blurry vision, numbness, shortness of breath, chest pain, edema, cough, abdominal pain, nausea, vomiting,  diarrhea, dysuria, fevers, rashes or hallucinations unless otherwise stated above in HPI. ____________________________________________   PHYSICAL EXAM:  VITAL SIGNS: Vitals:   23-Nov-2016 1621 11-23-2016 1627  BP: (!) 43/26 (!) 0/0  Pulse: 64 (!) 0    Constitutional: critically ill appearing, CPR in progress, cyanotic above neck Eyes: pupils midpoint and equal  Head: Atraumatic. Nose: No blood Mouth/Throat: dentition atraumatic Neck: cyanotic Cardiovascular: pulseless Respiratory: agonal respirations Gastrointestinal: no distention Genitourinary: normal external genitalia Musculoskeletal: No lower extremity edema. Neurologic: GCS 3 Skin:  Skin is cool and  cyanotic  ____________________________________________   LABS (all labs ordered are listed, but only abnormal results are displayed)  Results for orders placed or performed during the hospital encounter of 2016-11-23 (from the past 24 hour(s))  CBC with Differential     Status: Abnormal   Collection Time: Nov 23, 2016  2:54 PM  Result Value Ref Range   WBC 11.9 (H) 3.8 - 10.6 K/uL   RBC 4.20 (L) 4.40 - 5.90 MIL/uL   Hemoglobin 12.9 (L) 13.0 - 18.0 g/dL   HCT 31.3 43.0 - 65.2 %   MCV 96.9 80.0 - 100.0 fL   MCH 30.7 26.0 - 34.0 pg   MCHC 31.7 (L) 32.0 - 36.0 g/dL   RDW 27.0 91.4 - 33.0 %   Platelets 85 (L) 150 - 440 K/uL   Neutrophils Relative % 70 %   Neutro Abs 8.3 (H) 1.4 - 6.5 K/uL   Lymphocytes Relative 18 %   Lymphs Abs 2.2 1.0 - 3.6 K/uL   Monocytes Relative 8 %   Monocytes Absolute 1.0 0.2 - 1.0 K/uL   Eosinophils Relative 3 %   Eosinophils Absolute 0.3 0 - 0.7 K/uL   Basophils Relative 1 %   Basophils Absolute 0.1 0 - 0.1 K/uL  Comprehensive metabolic panel     Status: Abnormal   Collection Time: 2016-11-23  2:54 PM  Result Value Ref Range   Sodium 137 135 - 145 mmol/L   Potassium 5.4 (H) 3.5 - 5.1 mmol/L   Chloride 97 (L) 101 - 111 mmol/L   CO2 20 (L) 22 - 32 mmol/L   Glucose, Bld 284 (H) 65 - 99 mg/dL    BUN 29 (H) 6 - 20 mg/dL   Creatinine, Ser 6.71 (H) 0.61 - 1.24 mg/dL   Calcium 48.2 (H) 8.9 - 10.3 mg/dL   Total Protein 4.9 (L) 6.5 - 8.1 g/dL   Albumin 2.4 (L) 3.5 - 5.0 g/dL   AST 309 (H) 15 - 41 U/L   ALT 49 17 - 63 U/L   Alkaline Phosphatase 78 38 - 126 U/L   Total Bilirubin 0.5 0.3 - 1.2 mg/dL   GFR calc non Af Amer 36 (L) >60 mL/min   GFR calc Af Amer 41 (L) >60 mL/min   Anion gap 20 (H) 5 - 15  Troponin I     Status:  Abnormal   Collection Time: 11/18/2016  2:54 PM  Result Value Ref Range   Troponin I 0.07 (HH) <0.03 ng/mL   ____________________________________________  EKG My review and personal interpretation at Time: 14:40   Indication: cardiac arrest  Rate: 125  Rhythm: sinus Axis: normal Other: RBBB with discordant ST elevation in III and AVF as well as AVR and V1 ____________________________________________  RADIOLOGY  CTA chest with large clot burden as well as pneumomediastinum ____________________________________________   PROCEDURES  Procedure(s) performed:  Procedure Name: Intubation Date/Time: 11-18-16 4:39 PM Performed by: Merlyn Lot Pre-anesthesia Checklist: Emergency Drugs available Oxygen Delivery Method: Ambu bag Intubation Type: Rapid sequence Ventilation: Mask ventilation without difficulty Laryngoscope Size: Glidescope and 4 Grade View: Grade I Tube size: 7.5 mm Number of attempts: 1 Airway Equipment and Method: Stylet Placement Confirmation: ETT inserted through vocal cords under direct vision,  Positive ETCO2,  CO2 detector and Breath sounds checked- equal and bilateral Secured at: 25 cm Tube secured with: ETT holder Dental Injury: Teeth and Oropharynx as per pre-operative assessment     .Central Line Date/Time: 11-18-16 4:39 PM Performed by: Merlyn Lot Authorized by: Merlyn Lot   Consent:    Consent obtained:  Emergent situation   Consent given by:  Patient Pre-procedure details:    Skin preparation:  2%  chlorhexidine Anesthesia (see MAR for exact dosages):    Anesthesia method:  None Procedure details:    Location:  R femoral   Site selection rationale:  CPR in progress   Patient position:  Flat   Procedural supplies:  Triple lumen   Catheter size:  8 Fr   Landmarks identified: yes     Ultrasound guidance: no     Number of attempts:  1   Successful placement: yes   Post-procedure details:    Post-procedure:  Dressing applied and line sutured   Assessment:  Blood return through all ports      Critical Care performed: yes CRITICAL CARE Performed by: Merlyn Lot   Total critical care time: 70 minutes  Critical care time was exclusive of separately billable procedures and treating other patients.  Critical care was necessary to treat or prevent imminent or life-threatening deterioration.  Critical care was time spent personally by me on the following activities: development of treatment plan with patient and/or surrogate as well as nursing, discussions with consultants, evaluation of patient's response to treatment, examination of patient, obtaining history from patient or surrogate, ordering and performing treatments and interventions, ordering and review of laboratory studies, ordering and review of radiographic studies, pulse oximetry and re-evaluation of patient's condition.  ____________________________________________   INITIAL IMPRESSION / ASSESSMENT AND PLAN / ED COURSE  Pertinent labs & imaging results that were available during my care of the patient were reviewed by me and considered in my medical decision making (see chart for details).  DDX: PE, acs, dissection, sepsis, electrolye abn  Gregory Irwin is a 66 y.o. who presents to the ED with witnessed cardiac arrest. Patient critically ill. Please see medical record for documentation of extensive and prolonged code. On initial arrival to the ER the patient was cyanotic. Patient with history of B-cell  lymphoma performed was concern for pulmonary embolism. After intubation for airway protection given GCS of 3 with return of spontaneous circulation a bedside ultrasound was performed which showed evidence of a markedly dilated right ventricle. His EKG did show evidence of right heart strain and the decision was made to take the patient emergently to CT scanner to  evaluate for pulmonary embolism. While the patient was initially stabilized on an IV norepinephrine drip with multiple doses of sodium bicarbonate given for cardiac stabilization and to treat his probable acute metabolic acidosis given prolonged CPR, he subsequently bradyed down while on the CT table. After additional dose of epinephrine and one round of CPR were able to get pulses back long enough to perform CT scan which confirmed diagnosis of massive pulmonary embolism. Did have evidence of acute pneumo mediastinum likely secondary to mechanical trauma given CPR and field.  He was started on TPA for massive pulmonary embolism.  Patient was stabilized pressure. At time but due to the critical nature of his presentation developed worsening cardiac instability requiring further pressor support. After further discussion with the family and with ICU attending Dr. Rosita Fire at bedside decision was made to make the patient DO NOT RESUSCITATE. Shortly thereafter the patient then became more hypotensive and supple quickly went into asystole.      ____________________________________________   FINAL CLINICAL IMPRESSION(S) / ED DIAGNOSES  Final diagnoses:  Cardiac arrest ([place])  Acute massive pulmonary embolism ([place])  Acute respiratory failure with hypoxia ([place])      NEW MEDICATIONS STARTED DURING THIS VISIT:  New Prescriptions   No medications on file     Note:  This document was prepared using Dragon voice recognition software and may include unintentional dictation errors.    Merlyn Lot, MD 20-Nov-2016 [phone]

## 2016-11-21 NOTE — Code Documentation (Signed)
Taking pt to CT

## 2016-11-21 NOTE — Progress Notes (Signed)
Chaplain was paged to the Unit about 3:30pm and the patient had a weak pulse and they were performing CPR. The family had their own Chaplain with them, but the staff wanted me present to keep the traffic flow running smoothly. I was there for staff and the family but Gregory Irwin did pass away.

## 2016-11-21 NOTE — Code Documentation (Addendum)
Restarted cpr - pulse 0

## 2016-11-21 NOTE — Telephone Encounter (Signed)
Called and spoke with daughter, she advised me that patient was in [place] arrest and being brought to [facility]

## 2016-11-21 NOTE — Code Documentation (Signed)
Pulse 0 - CPR restarted

## 2016-11-21 NOTE — Code Documentation (Addendum)
cpr stopped - pulse 122

## 2016-11-21 NOTE — Code Documentation (Signed)
Pulse - CPR stopped

## 2016-11-21 NOTE — ED Notes (Signed)
Pt transported to the morgue  

## 2016-11-21 NOTE — H&P (Signed)
[place] [facility] [place] NAME: Gregory Irwin    MR#:  [id number]  DATE OF BIRTH:  11-Sep-1950  DATE OF ADMISSION:  Nov 01, 2016  PRIMARY CARE PHYSICIAN: Albina Billet, MD   REQUESTING/REFERRING PHYSICIAN: Dr.Robinson  CHIEF COMPLAINT:  No chief complaint on file.   HISTORY OF PRESENT ILLNESS: Gregory Irwin  is a 66 y.o. male with a known history of chronic kidney disease, diabetes, hyperlipidemia, recently diagnosed with B-cell lymphoma and plan was to start chemotherapy next week, was at home after his nap sat on the couch and wife gave him a sandwich to 8 and suddenly she noted he started having seizures so she called 911 and right away patient's daughter and EMS responders started doing CPR. CPR was lasted for 40 minutes while he was brought to emergency room he had some V. tach and he was intubated in ER. Initial labs were sent and he was taken to CT scan of the chest where again he started to have bradycardia and short period of CPR was done for almost 1 minute. But he responded to atropine and epinephrine. CT scan reported by massive bilateral pulmonary emboli and right heart strain. In ER physician after speaking to pulmonologist and intensivist started on thrombolytics therapy and he gave his admission to hospitalist team.  patient's wife and daughter were present in the room and they will source of my history.  PAST MEDICAL HISTORY:   Past Medical History:  Diagnosis Date  . Chronic kidney disease    lisinoprol given  . Diabetes mellitus without complication ([place])   . FH: colon polyps   . Hemorrhage of rectum and anus   . Hyperlipidemia   . Skin cancer     PAST SURGICAL HISTORY: Past Surgical History:  Procedure Laterality Date  . COLONOSCOPY  2008    SOCIAL HISTORY:  Social History  Substance Use Topics  . Smoking status: Former Smoker    Packs/day: 1.00    Years: 25.00    Quit date: 10/23/1975  . Smokeless tobacco: Current User     Types: Chew  . Alcohol use Yes    FAMILY HISTORY:  Family History  Problem Relation Age of Onset  . Aortic aneurysm Mother   . Coronary artery disease Father   . Heart attack Father   . Diabetes Brother   . COPD Brother   . Renal Disease Brother   . Diabetes Brother   . Diabetes Brother   . Liver disease Brother   . Coronary artery disease Brother   . Diabetes Brother   . Diabetes Brother   . Pancreatic cancer Sister     DRUG ALLERGIES: No Known Allergies  REVIEW OF SYSTEMS:   Patient is not able to provide review of systems due to critical condition.  MEDICATIONS AT HOME:  Prior to Admission medications   Medication Sig Start Date End Date Taking? Authorizing Provider  allopurinol (ZYLOPRIM) 300 MG tablet Take 1 tablet (300 mg total) by mouth daily. 10/22/16  Yes Lloyd Huger, MD  Cinnamon 500 MG capsule Take by mouth.   Yes [provider]  lisinopril (PRINIVIL,ZESTRIL) 2.5 MG tablet Take 2.5 mg by mouth daily. 09/03/16  Yes [provider]  metFORMIN (GLUCOPHAGE) 500 MG tablet Take 500 mg by mouth 2 (two) times daily with a meal.   Yes [provider]  ondansetron (ZOFRAN) 8 MG tablet Take 1 tablet (8 mg total) by mouth 2 (two) times daily as needed  for refractory nausea / vomiting. Start on day 3 after cyclophosphamide chemotherapy. 10/22/16  Yes Lloyd Huger, MD  Oxycodone HCl 10 MG TABS Take 10 mg by mouth 2 (two) times daily. 10/21/16  Yes [provider]  predniSONE (DELTASONE) 20 MG tablet Take 5 tablets (100 mg total) by mouth daily. Take on days 1-5 of chemotherapy. 10/22/16  Yes Lloyd Huger, MD  prochlorperazine (COMPAZINE) 10 MG tablet Take 1 tablet (10 mg total) by mouth every 6 (six) hours as needed (Nausea or vomiting). 10/22/16  Yes Lloyd Huger, MD  Lancets Misc. (ACCU-CHEK FASTCLIX LANCET) KIT Accu-Chek FastClix    [provider]  Lido-Capsaicin-Men-Methyl Sal (1ST MEDX-PATCH/ LIDOCAINE)  4-0.373-10-10 % PTCH Apply topically.    [provider]  lidocaine-prilocaine (EMLA) cream Apply to affected area once 10/22/16   Lloyd Huger, MD  oxycodone (OXY-IR) 5 MG capsule Take 10 mg by mouth 2 (two) times daily.    [provider]      PHYSICAL EXAMINATION:   VITAL SIGNS: Blood pressure (!) 187/48, pulse (!) 120.  GENERAL:  66 y.o.-year-old patient lying in the bed with Critical appearance.  EYES: Pupils Dilated bilaterally. No scleral icterus.  HEENT: Head atraumatic, normocephalic. Oropharynx and nasopharynx clear.  NECK:  Supple, no jugular venous distention. No thyroid enlargement, no tenderness.  LUNGS: Normal breath sounds bilaterally, no wheezing, rales,rhonchi or crepitation. No use of accessory muscles of respiration. ET tube in place and ventilatory support. CARDIOVASCULAR: S1, S2 normal. No murmurs, rubs, or gallops.  ABDOMEN: Soft, nontender, nondistended. Bowel sounds present. No organomegaly or mass.  EXTREMITIES: No pedal edema,  or clubbing. His limbs are already cyanotic. Intraoral she is lying is present in the left tibia and central line is present in the right groin. NEUROLOGIC: Pupils are dilated, patient is on ventilatory support. PSYCHIATRIC: The patient is status post cardiac arrest and ventilatory support currently.  SKIN: No obvious rash, lesion, or ulcer.   LABORATORY PANEL:   CBC  Recent Labs Lab 10/19/16 0817 Nov 21, 2016 1454  WBC 10.3 11.9*  HGB 14.1 12.9*  HCT 41.2 40.7  PLT 185 85*  MCV 88.6 96.9  MCH 30.3 30.7  MCHC 34.3 31.7*  RDW 13.3 14.1  LYMPHSABS 0.8* 2.2  MONOABS 1.5* 1.0  EOSABS 0.4 0.3  BASOSABS 0.0 0.1   ------------------------------------------------------------------------------------------------------------------  Chemistries   Recent Labs Lab 11-21-2016 1454  NA 137  K 5.4*  CL 97*  CO2 20*  GLUCOSE 284*  BUN 29*  CREATININE 1.89*  CALCIUM 12.3*  AST 132*  ALT 49  ALKPHOS 78   BILITOT 0.5   ------------------------------------------------------------------------------------------------------------------ estimated creatinine clearance is 46.6 mL/min (A) (by C-G formula based on SCr of 1.89 mg/dL (H)). ------------------------------------------------------------------------------------------------------------------ No results for input(s): TSH, T4TOTAL, T3FREE, THYROIDAB in the last 72 hours.  Invalid input(s): FREET3   Coagulation profile  Recent Labs Lab 10/19/16 0817  INR 1.07   ------------------------------------------------------------------------------------------------------------------- No results for input(s): DDIMER in the last 72 hours. -------------------------------------------------------------------------------------------------------------------  Cardiac Enzymes  Recent Labs Lab 11-21-2016 1454  TROPONINI 0.07*   ------------------------------------------------------------------------------------------------------------------ Invalid input(s): POCBNP  ---------------------------------------------------------------------------------------------------------------  Urinalysis No results found for: COLORURINE, APPEARANCEUR, LABSPEC, PHURINE, GLUCOSEU, HGBUR, BILIRUBINUR, KETONESUR, PROTEINUR, UROBILINOGEN, NITRITE, LEUKOCYTESUR   RADIOLOGY: Ct Angio Chest Pe W Or Wo Contrast  Result Date: 11/21/2016 CLINICAL DATA:  Cardiac arrest. EXAM: CT ANGIOGRAPHY CHEST WITH CONTRAST TECHNIQUE: Multidetector CT imaging of the chest was performed using the standard protocol during bolus administration of intravenous contrast. Multiplanar CT image reconstructions  and MIPs were obtained to evaluate the vascular anatomy. CONTRAST:  75 cc Isovue 370 COMPARISON:  None FINDINGS: Cardiovascular: The patient has massive bilateral pulmonary emboli including almost all of the pulmonary arteries in both lungs with only a few small branches partially filling. RV LV  ratio is markedly abnormal indicating elevated right heart pressure. There is a large pericardial effusion. Endotracheal tube is in good position. There are multiple enlarged anterior and superior mediastinal lymph nodes. There bilateral pulmonary infiltrates which could represent a combination of pulmonary edema and pulmonary infarction. There is also adenopathy in the right and left pericardial fat pads. Upper Abdomen: Massive bulky tumor in the upper abdomen as described on the CT scan of 10/05/2016. Musculoskeletal: Multiple fractured anterior ribs adjacent to the costochondral junctions consistent with recent CPR. Review of the MIP images confirms the above findings. IMPRESSION: 1. Massive bilateral pulmonary emboli. Markedly elevated right heart pressure. 2. Extensive mediastinal adenopathy. 3. Bulky tumor in the upper abdomen, biopsy proven lymphoma. 4. Multiple anterior rib fractures secondary to CPR. Electronically Signed   By: Lorriane Shire M.D.   On: Nov 19, 2016 15:44    EKG: Orders placed or performed during the hospital encounter of 2016/11/19  . EKG 12-Lead  . EKG 12-Lead    IMPRESSION AND PLAN:  * Status post cardiac arrest   Approximately 40 minutes of CPR   Prognosis is extremely poor currently on ventilatory support.   Further management per ICU team.  * Massive bilateral PE   With right heart strain.   Likely secondary to B-cell lymphoma.    Plan is to give TPA as per ER physician.   Further management per ICU team.  * Recent diagnosis of B-cell lymphoma   Patient follows at [facility].  * Diabetes   May need insulin sliding scale coverage.  * Acute renal failure   Continue monitoring.  All the records are reviewed and case discussed with ED provider. Management plans discussed with the patient, family and they are in agreement.  CODE STATUS: Full code  condition is critical and extremely poor prognosis discussed with patient's wife and daughter in the  room. Code Status History    This patient does not have a recorded code status. Please follow your organizational policy for patients in this situation.       TOTAL TIME TAKING CARE OF THIS PATIENT: 50 critical care minutes.    Vaughan Basta M.D on 11-19-16   Between 7am to 6pm - Pager - [phone]  After 6pm go to [email or web address] - password [email or web address] [facility]  Office  [phone]  CC: Primary care physician; Albina Billet, MD   Note: This dictation was prepared with Dragon dictation along with smaller phrase technology. Any transcriptional errors that result from this process are unintentional.

## 2016-11-21 NOTE — Code Documentation (Signed)
Pulse regained - cpr stopped

## 2016-11-21 NOTE — Consult Note (Signed)
Vaughan Basta, MD Physician Signed Internal Medicine  H&P Date of Service: 2016-11-22 4:00 PM    Expand All Collapse All   _0 Hide copied text _1 Hover for attribution information [facility] NAME: Gregory Irwin    MR#:  [id number]  DATE OF BIRTH:  04-Dec-1950  DATE OF ADMISSION:  11/22/2016  PRIMARY CARE PHYSICIAN: Albina Billet, MD   REQUESTING/REFERRING PHYSICIAN: Dr.Robinson  CHIEF COMPLAINT:  No chief complaint on file.   HISTORY OF PRESENT ILLNESS: Gregory Irwin  is a 66 y.o. male with a known history of chronic kidney disease, diabetes, hyperlipidemia, recently diagnosed with B-cell lymphoma and plan was to start chemotherapy next week, was at home after his nap sat on the couch and wife gave him a sandwich to 8 and suddenly she noted he started having seizures so she called 911 and right away patient's daughter and EMS responders started doing CPR. CPR was lasted for 40 minutes while he was brought to emergency room he had some V. tach and he was intubated in ER. Initial labs were sent and he was taken to CT scan of the chest where again he started to have bradycardia and short period of CPR was done for almost 1 minute. But he responded to atropine and epinephrine. CT scan reported by massive bilateral pulmonary emboli and right heart strain. In ER physician after speaking to pulmonologist and intensivist started on thrombolytics therapy and he gave his admission to hospitalist team.  patient's wife and daughter were present in the room and they will source of my history.  PAST MEDICAL HISTORY:       Past Medical History:  Diagnosis Date  . Chronic kidney disease    lisinoprol given  . Diabetes mellitus without complication ([place])   . FH: colon polyps   . Hemorrhage of rectum and anus   . Hyperlipidemia   . Skin cancer          PAST SURGICAL HISTORY: Past Surgical History:  Procedure Laterality  Date  . COLONOSCOPY  2008    SOCIAL HISTORY:        Social History  Substance Use Topics  . Smoking status: Former Smoker    Packs/day: 1.00    Years: 25.00    Quit date: 10/23/1975  . Smokeless tobacco: Current User    Types: Chew  . Alcohol use Yes     FAMILY HISTORY:       Family History  Problem Relation Age of Onset  . Aortic aneurysm Mother   . Coronary artery disease Father   . Heart attack Father   . Diabetes Brother   . COPD Brother   . Renal Disease Brother   . Diabetes Brother   . Diabetes Brother   . Liver disease Brother   . Coronary artery disease Brother   . Diabetes Brother   . Diabetes Brother   . Pancreatic cancer Sister     DRUG ALLERGIES: No Known Allergies  REVIEW OF SYSTEMS:   Patient is not able to provide review of systems due to critical condition.  MEDICATIONS AT HOME:         Prior to Admission medications   Medication Sig Start Date End Date Taking? Authorizing Provider  allopurinol (ZYLOPRIM) 300 MG tablet Take 1 tablet (300 mg total) by mouth daily. 10/22/16  Yes Lloyd Huger, MD  Cinnamon 500 MG capsule Take by mouth.   Yes [provider]  lisinopril (PRINIVIL,ZESTRIL)  2.5 MG tablet Take 2.5 mg by mouth daily. 09/03/16  Yes [provider]  metFORMIN (GLUCOPHAGE) 500 MG tablet Take 500 mg by mouth 2 (two) times daily with a meal.   Yes [provider]  ondansetron (ZOFRAN) 8 MG tablet Take 1 tablet (8 mg total) by mouth 2 (two) times daily as needed for refractory nausea / vomiting. Start on day 3 after cyclophosphamide chemotherapy. 10/22/16  Yes Lloyd Huger, MD  Oxycodone HCl 10 MG TABS Take 10 mg by mouth 2 (two) times daily. 10/21/16  Yes [provider]  predniSONE (DELTASONE) 20 MG tablet Take 5 tablets (100 mg total) by mouth daily. Take on days 1-5 of chemotherapy. 10/22/16  Yes Lloyd Huger, MD  prochlorperazine (COMPAZINE) 10 MG tablet  Take 1 tablet (10 mg total) by mouth every 6 (six) hours as needed (Nausea or vomiting). 10/22/16  Yes Lloyd Huger, MD  Lancets Misc. (ACCU-CHEK FASTCLIX LANCET) KIT Accu-Chek FastClix    [provider]  Lido-Capsaicin-Men-Methyl Sal (1ST MEDX-PATCH/ LIDOCAINE) 4-0.373-10-10 % PTCH Apply topically.    [provider]  lidocaine-prilocaine (EMLA) cream Apply to affected area once 10/22/16   Lloyd Huger, MD  oxycodone (OXY-IR) 5 MG capsule Take 10 mg by mouth 2 (two) times daily.    [provider]      PHYSICAL EXAMINATION:   VITAL SIGNS: Blood pressure (!) 187/48, pulse (!) 120.  GENERAL:  66 y.o.-year-old patient lying in the bed with Critical appearance.  EYES: Pupils Dilated bilaterally. No scleral icterus.  HEENT: Head atraumatic, normocephalic. Oropharynx and nasopharynx clear.  NECK:  Supple, no jugular venous distention. No thyroid enlargement, no tenderness.  LUNGS: Normal breath sounds bilaterally, no wheezing, rales,rhonchi or crepitation. No use of accessory muscles of respiration. ET tube in place and ventilatory support. CARDIOVASCULAR: S1, S2 normal. No murmurs, rubs, or gallops.  ABDOMEN: Soft, nontender, nondistended. Bowel sounds present. No organomegaly or mass.  EXTREMITIES: No pedal edema,  or clubbing. His limbs are already cyanotic. Intraoral she is lying is present in the left tibia and central line is present in the right groin. NEUROLOGIC: Pupils are dilated, patient is on ventilatory support. PSYCHIATRIC: The patient is status post cardiac arrest and ventilatory support currently.  SKIN: No obvious rash, lesion, or ulcer.   LABORATORY PANEL:   CBC  Last Labs    Recent Labs Lab 10/19/16 0817 Nov 16, 2016 1454  WBC 10.3 11.9*  HGB 14.1 12.9*  HCT 41.2 40.7  PLT 185 85*  MCV 88.6 96.9  MCH 30.3 30.7  MCHC 34.3 31.7*  RDW 13.3 14.1  LYMPHSABS 0.8* 2.2  MONOABS 1.5* 1.0  EOSABS 0.4 0.3  BASOSABS  0.0 0.1     ------------------------------------------------------------------------------------------------------------------  Chemistries   Last Labs    Recent Labs Lab 11-16-2016 1454  NA 137  K 5.4*  CL 97*  CO2 20*  GLUCOSE 284*  BUN 29*  CREATININE 1.89*  CALCIUM 12.3*  AST 132*  ALT 49  ALKPHOS 78  BILITOT 0.5     ------------------------------------------------------------------------------------------------------------------ estimated creatinine clearance is 46.6 mL/min (A) (by C-G formula based on SCr of 1.89 mg/dL (H)). ------------------------------------------------------------------------------------------------------------------  Recent Labs (last 2 labs)   No results for input(s): TSH, T4TOTAL, T3FREE, THYROIDAB in the last 72 hours.  Invalid input(s): FREET3     Coagulation profile  Last Labs    Recent Labs Lab 10/19/16 0817  INR 1.07     ------------------------------------------------------------------------------------------------------------------- Recent Labs (last 2 labs)   No  results for input(s): DDIMER in the last 72 hours.   -------------------------------------------------------------------------------------------------------------------  Cardiac Enzymes  Last Labs    Recent Labs Lab October 26, 2016 1454  TROPONINI 0.07*     ------------------------------------------------------------------------------------------------------------------ Last Labs   Invalid input(s): POCBNP    ---------------------------------------------------------------------------------------------------------------  Urinalysis Labs (Brief)  No results found for: COLORURINE, APPEARANCEUR, LABSPEC, PHURINE, GLUCOSEU, HGBUR, BILIRUBINUR, KETONESUR, PROTEINUR, UROBILINOGEN, NITRITE, LEUKOCYTESUR     RADIOLOGY:  Imaging Results (Last 48 hours)  Ct Angio Chest Pe W Or Wo Contrast  Result Date: 10/26/2016 CLINICAL DATA:  Cardiac arrest.  EXAM: CT ANGIOGRAPHY CHEST WITH CONTRAST TECHNIQUE: Multidetector CT imaging of the chest was performed using the standard protocol during bolus administration of intravenous contrast. Multiplanar CT image reconstructions and MIPs were obtained to evaluate the vascular anatomy. CONTRAST:  75 cc Isovue 370 COMPARISON:  None FINDINGS: Cardiovascular: The patient has massive bilateral pulmonary emboli including almost all of the pulmonary arteries in both lungs with only a few small branches partially filling. RV LV ratio is markedly abnormal indicating elevated right heart pressure. There is a large pericardial effusion. Endotracheal tube is in good position. There are multiple enlarged anterior and superior mediastinal lymph nodes. There bilateral pulmonary infiltrates which could represent a combination of pulmonary edema and pulmonary infarction. There is also adenopathy in the right and left pericardial fat pads. Upper Abdomen: Massive bulky tumor in the upper abdomen as described on the CT scan of 10/05/2016. Musculoskeletal: Multiple fractured anterior ribs adjacent to the costochondral junctions consistent with recent CPR. Review of the MIP images confirms the above findings. IMPRESSION: 1. Massive bilateral pulmonary emboli. Markedly elevated right heart pressure. 2. Extensive mediastinal adenopathy. 3. Bulky tumor in the upper abdomen, biopsy proven lymphoma. 4. Multiple anterior rib fractures secondary to CPR. Electronically Signed   By: Lorriane Shire M.D.   On: October 26, 2016 15:44     EKG:    Orders placed or performed during the hospital encounter of 2016-10-26  . EKG 12-Lead  . EKG 12-Lead    IMPRESSION AND PLAN:  * Status post cardiac arrest   Approximately 40 minutes of CPR   Prognosis is extremely poor currently on ventilatory support.   Further management per ICU team.  * Massive bilateral PE   With right heart strain.   Likely secondary to B-cell lymphoma.    Plan is to give  TPA as per ER physician.   Further management per ICU team.  * Recent diagnosis of B-cell lymphoma   Patient follows at [facility].  * Diabetes   May need insulin sliding scale coverage.  * Acute renal failure   Continue monitoring.  All the records are reviewed and case discussed with ED provider. Management plans discussed with the patient, family and they are in agreement.  CODE STATUS: Full code  condition is critical and extremely poor prognosis discussed with patient's wife and daughter in the room.    Code Status History    This patient does not have a recorded code status. Please follow your organizational policy for patients in this situation.       TOTAL TIME TAKING CARE OF THIS PATIENT: 50 critical care minutes.    Vaughan Basta M.D on October 26, 2016   Between 7am to 6pm - Pager - [phone]  After 6pm go to [email or web address] - password [email or web address] [place] [facility]  Office  [phone]  CC: Primary care physician; Albina Billet, MD   Note: This dictation was prepared with Dragon dictation along with smaller  Company secretary. Any transcriptional errors that result from this process are unintentional.    Routing History         Initially decided to  Admit the pt, as Intensivist came to meet the family and examine the pt, after discussion with him- family decided DNR and pt had cardiac arrest again. He died.

## 2016-11-21 NOTE — Code Documentation (Signed)
Patient time of death occurred at 76 - verified by Dr Quentin Cornwall

## 2016-11-21 NOTE — Code Documentation (Addendum)
cpr restarted - pulse 0

## 2016-11-21 NOTE — Code Documentation (Signed)
cpr stopped - pulse noted

## 2016-11-21 NOTE — Progress Notes (Signed)
Pharmacy Antibiotic Note  Gregory Irwin is a 66 y.o. male admitted on Nov 17, 2016 with sepsis.  Pharmacy has been consulted for vancomycin dosing.  Plan: Vancomycin 1 gm IV x 1 followed in approximately 6 hours (stacked dosing) by vancomycin 1 gm IV Q18H, predicted trough 15 mcg/mL. Pharmacy will continue to follow and adjust as needed to maintain trough 15 to 20 mcg/mL.      No data recorded.   Recent Labs Lab 10/19/16 0817 17-Nov-2016 1454  WBC 10.3 11.9*  CREATININE  --  1.89*    Estimated Creatinine Clearance: 46.6 mL/min (A) (by C-G formula based on SCr of 1.89 mg/dL (H)).    No Known Allergies  Thank you for allowing pharmacy to be a part of this patient's care.  Laural Benes, Pharm.D., BCPS Clinical Pharmacist 17-Nov-2016 3:48 PM

## 2016-11-21 NOTE — Code Documentation (Signed)
Called pharmacy for vasopressin

## 2016-11-21 DEATH — deceased

## 2016-11-27 ENCOUNTER — Other Ambulatory Visit: Payer: Self-pay | Admitting: Nurse Practitioner

## 2018-06-16 IMAGING — CT CT ABD-PELV W/ CM
1 of 3 series · 12 of 32 positions shown, 17 images · IV contrast (APPLIED)
Comparison: None.

ADDENDUM:
Findings were called to Dr. Singoue. Dr. Joons Antonius consult
Interventional Radiology for a CT guided biopsy of the mass.
CLINICAL DATA: Upper abdominal pain for 5 weeks. 40 pounds of
weight loss.

EXAM:
CT ABDOMEN AND PELVIS WITH CONTRAST
TECHNIQUE: Multidetector CT imaging of the abdomen and pelvis was performed
using the standard protocol following bolus administration of
intravenous contrast.
CONTRAST:  100mL [id number] IOPAMIDOL ([id number]) INJECTION 61%

[Series 3: axial st · axial · 0.88mm/px · z∈[+858,+1344]mm · 12 of 111 slices shown, 17 images]
[im 7/111  soft-tissue]
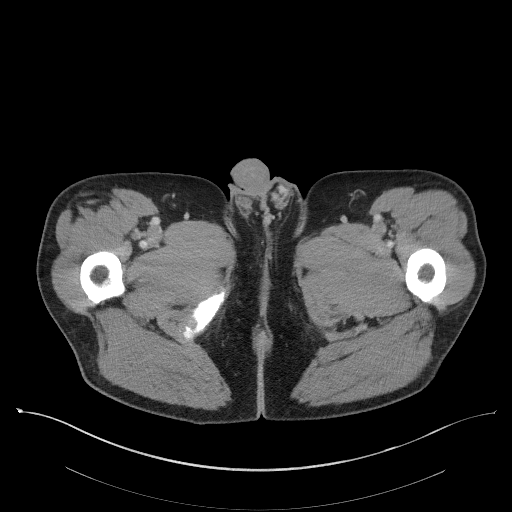
[im 7/111  bone]
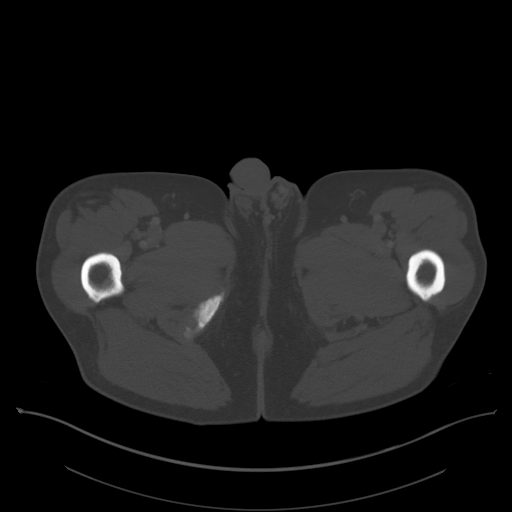
[im 20/111  soft-tissue]
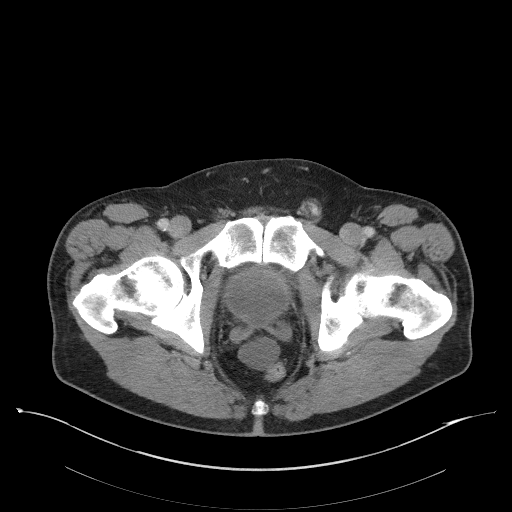
[im 26/111  soft-tissue]
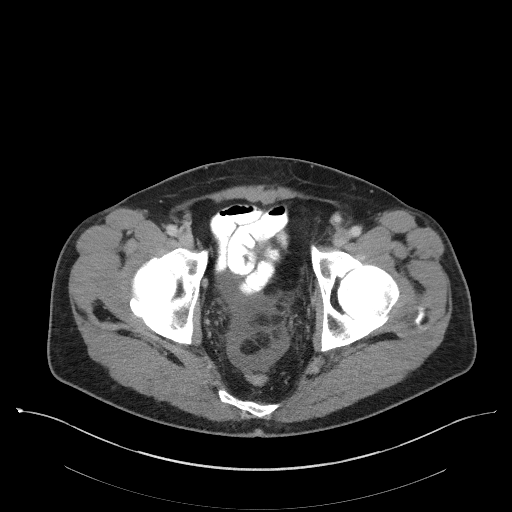
[im 39/111  soft-tissue]
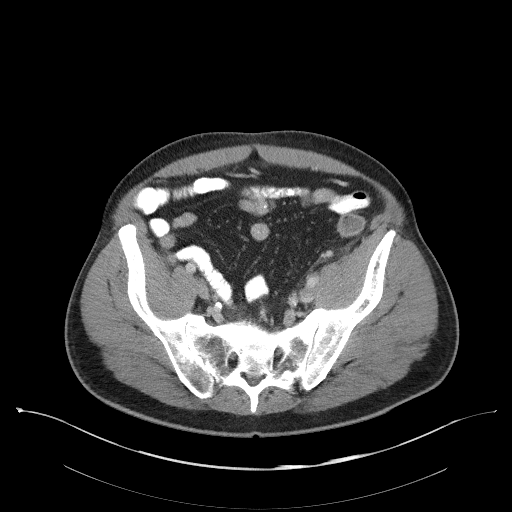
[im 46/111  soft-tissue]
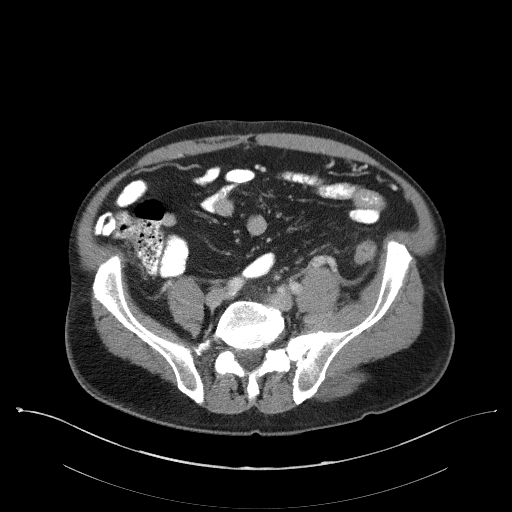
[im 59/111  soft-tissue]
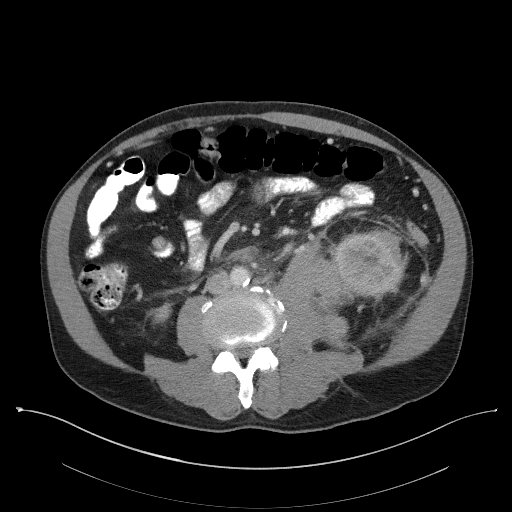
[im 65/111  soft-tissue]
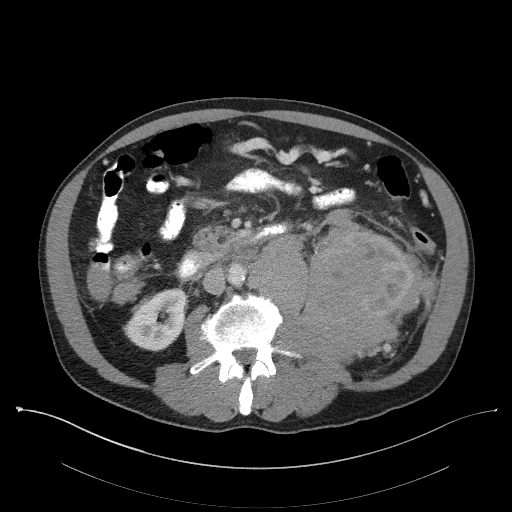
[im 72/111  soft-tissue]
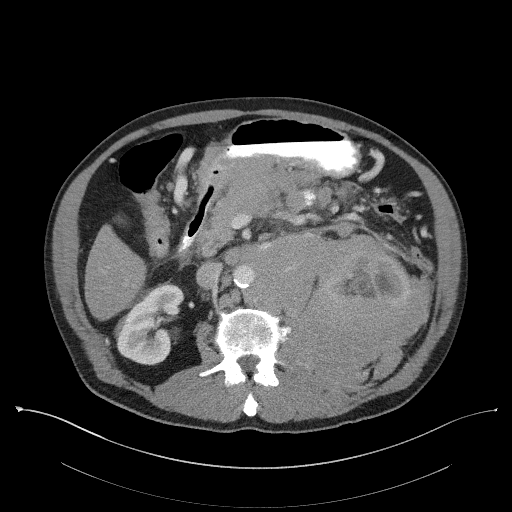
[im 85/111  soft-tissue]
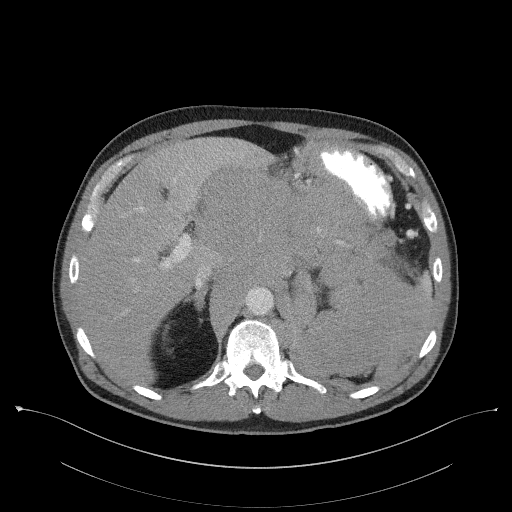
[im 85/111  lung]
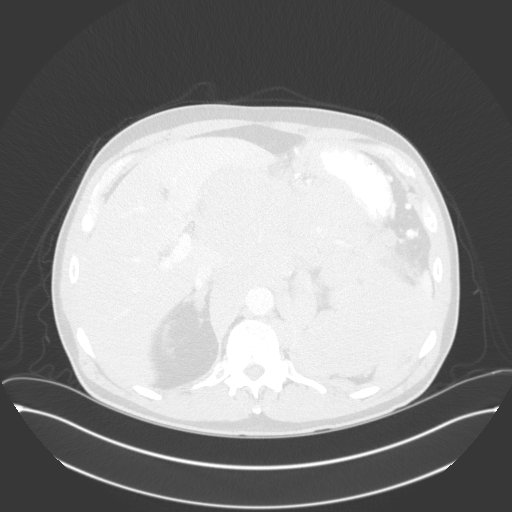
[im 85/111  bone]
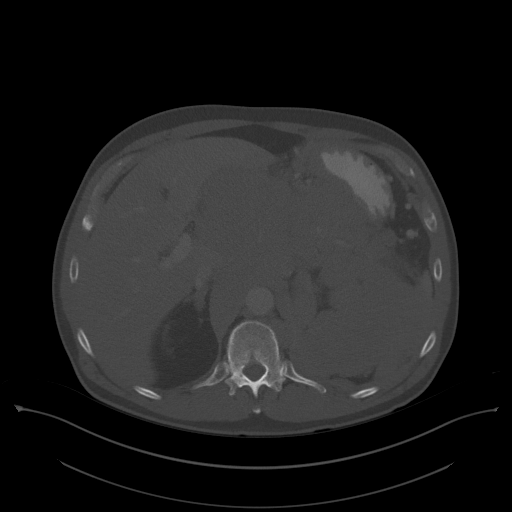
[im 91/111  soft-tissue]
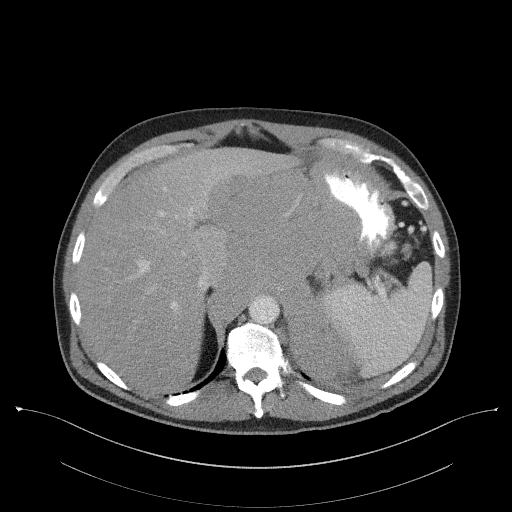
[im 91/111  lung]
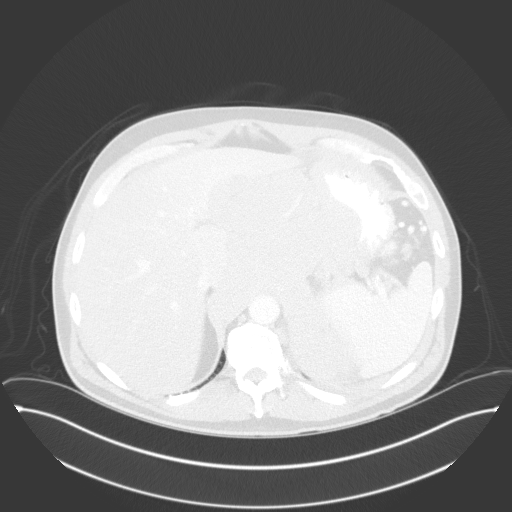
[im 98/111  lung]
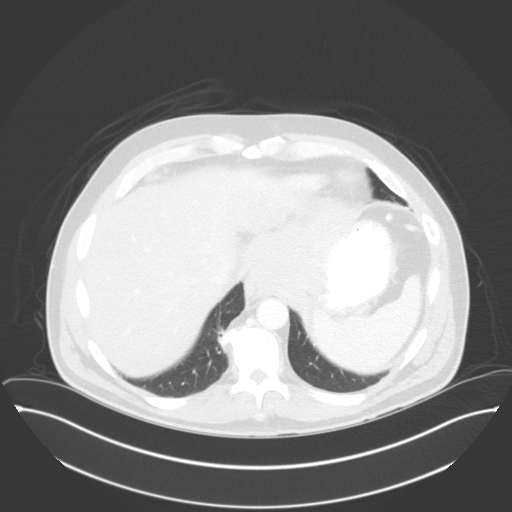
[im 104/111  soft-tissue]
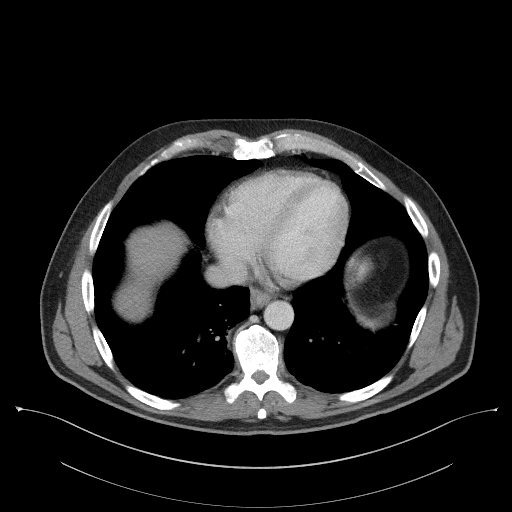
[im 104/111  lung]
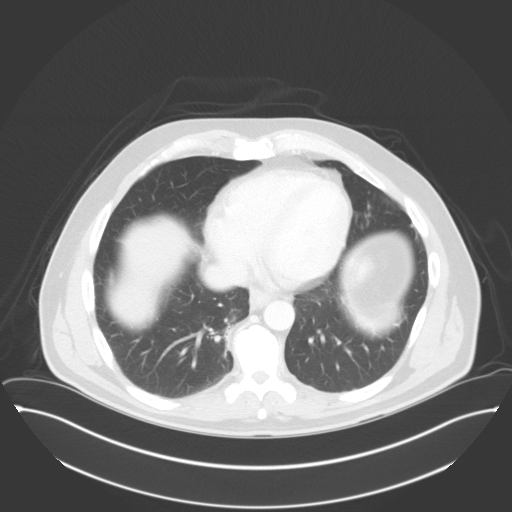

[12 of 32 positions shown; findings below may reference images not displayed]

FINDINGS: Lower chest: No acute abnormality.

Hepatobiliary: Hepatic steatosis identified. No focal liver masses
are noted. The gallbladder is normal. The portal vein is patent.

Pancreas: The pancreas is poorly evaluated due to a large mass in
the abdomen which will be described below. The pancreatic head and
neck however appear grossly unremarkable.

Spleen: No splenic masses are identified.

Adrenals/Urinary Tract: The right adrenal gland is normal. The left
adrenal gland is not well evaluated due to the abdominal mass which
encases at. The left kidney is encased and invaded by the abnormal
soft tissue with mild hydronephrosis superiorly. The left ureter is
encased a as well. The left renal vein is displaced anteriorly.
There is a rind of abnormal soft tissue around the right kidney but
the right kidney itself is normal in appearance. No right-sided
hydronephrosis or perinephric stranding. The right ureter is normal.
The bladder is unremarkable.

Stomach/Bowel: The large mass in the abdomen abuts the stomach.
Findings are suspicious for gastric involvement with apparent
thickening of the lesser curvature. The GE junction is encased as
well. The proximal duodenum is also encased. There is no bowel
obstruction. The remainder of the small bowel is normal. The colon
is normal in appearance. No suspicious mass or obstruction. The
appendix is grossly unremarkable.

Vascular/Lymphatic: Atherosclerosis is seen in the non aneurysmal
aorta and iliac vessels. The abnormal soft tissue mass in the
abdomen encases portions of the aorta and some of the arising
branches. The celiac and superior mesenteric arteries remain widely
patent as does the right renal artery. There is narrowing of the
left renal artery which is completely encased.

Reproductive: Prostate is unremarkable.

Other: There is ascites in the abdomen. There is a large soft tissue
mass which is centered in the central abdomen with apparent
involvement of the stomach. The mass also encases and invades the
left kidney. The mass measures at least 23 x 15 cm on axial image
28. The mass narrows the splenic vein, left renal vein, and left
renal artery. There is adenopathy in the left retrocrural region and
throughout the retroperitoneum, particularly on the left but also to
the right of midline. Abnormal nodes in the gastrohepatic ligament
and porta hepatis. Nodes in the pelvis appear to be spared.

Musculoskeletal: Sclerosis in the left side of the sacrum on series
3, image 74 is nonspecific and could be a bone island. No definitive
bony involvement.
IMPRESSION: 1. There is a large mass centered in the upper abdomen with probable
involvement of the stomach an definitive involvement of the left
kidney. The pancreatic body and tail are also encased. The mass
encases multiple artery's and veins with narrowing of the left
splenic vein, left renal vein, and left renal artery. There is
adenopathy in the retroperitoneum, particularly on the left. There
is nodularity in the fat anterior to the liver on image 13 and in an
epicardial node on image 15. These findings are not completely
specific but lymphoma is considered the most likely diagnosis. Other
primary carcinomas, including pancreatic or renal carcinomas are
considered less likely. Recommend correlation with tissue sampling.
The tissue may be easiest to sample through the stomach via
endoscopic ultrasound.
Findings are being called to the referring clinical team.

## 2018-06-30 IMAGING — CT CT BIOPSY
1 of 2 series · 15 of 32 positions shown, 19 images · non-contrast
Comparison: none

INDICATION: Large left retroperitoneal mass.

[Series 2: i-spiral 5.0 b30f · axial · 0.85mm/px · z∈[+174,+420]mm · 15 of 79 slices shown, 19 images]
[im 6/79  soft-tissue]
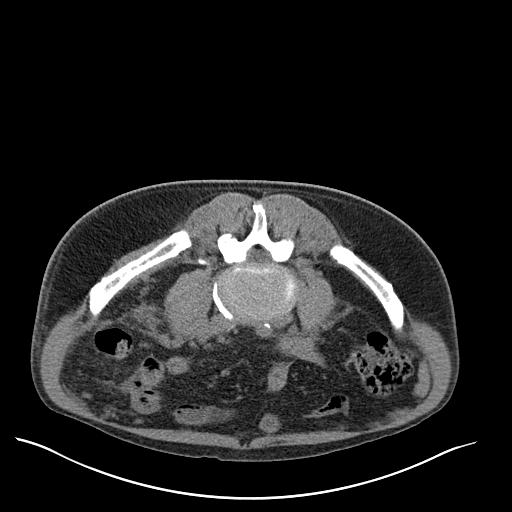
[im 6/79  bone]
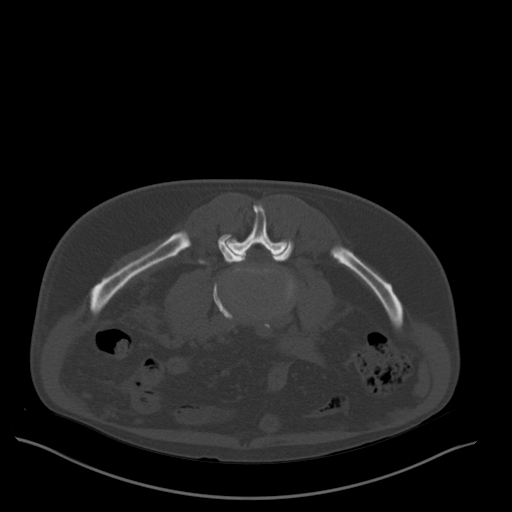
[im 12/79  soft-tissue]
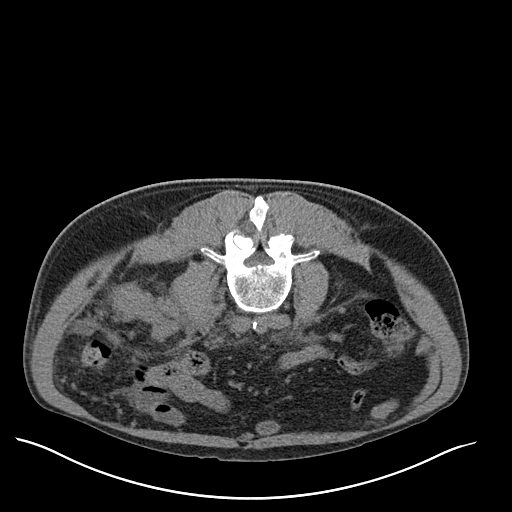
[im 18/79  soft-tissue]
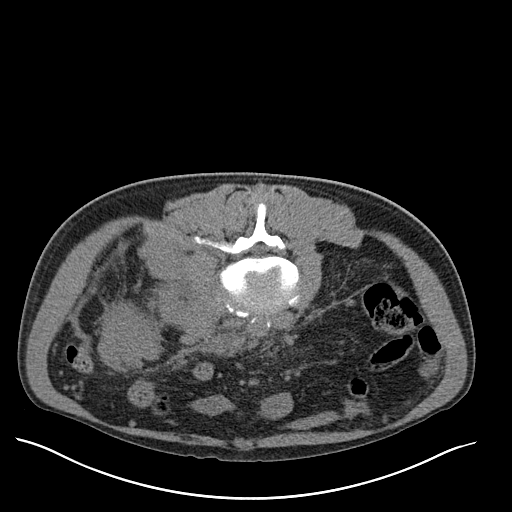
[im 24/79  soft-tissue]
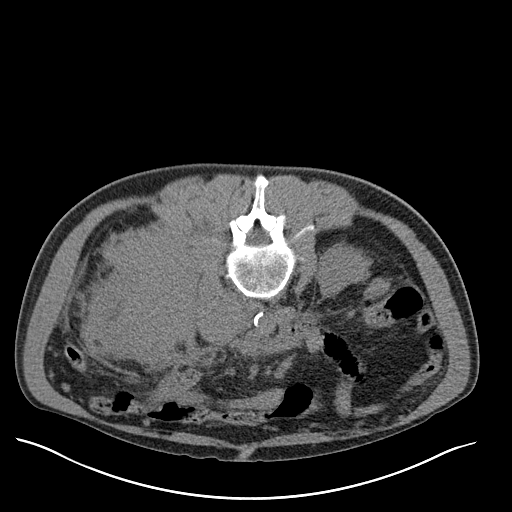
[im 29/79  soft-tissue]
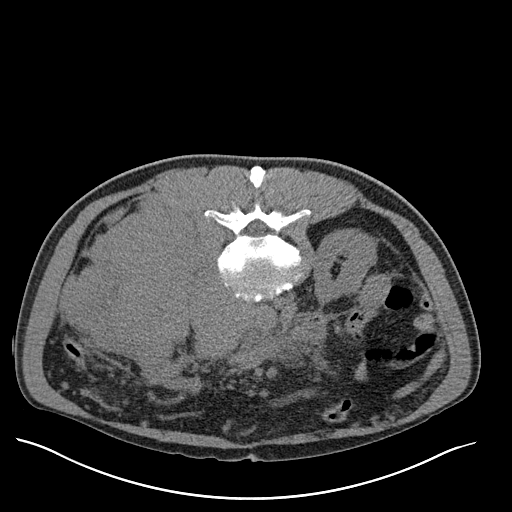
[im 35/79  soft-tissue]
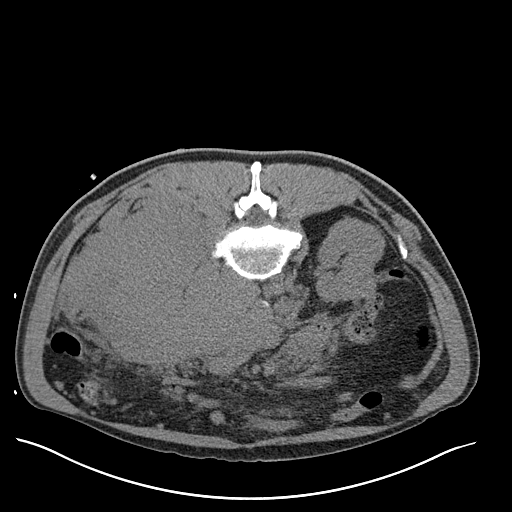
[im 41/79  soft-tissue]
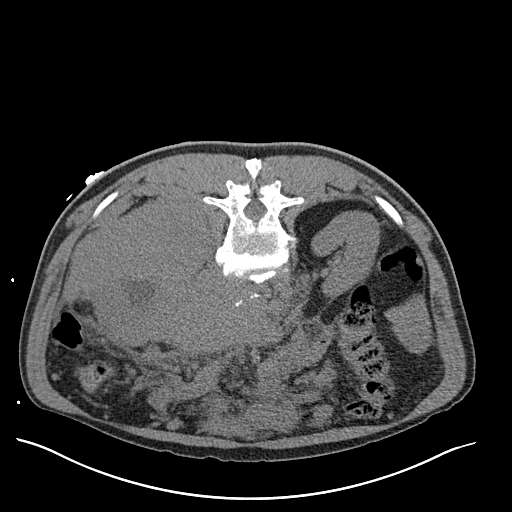
[im 47/79  soft-tissue]
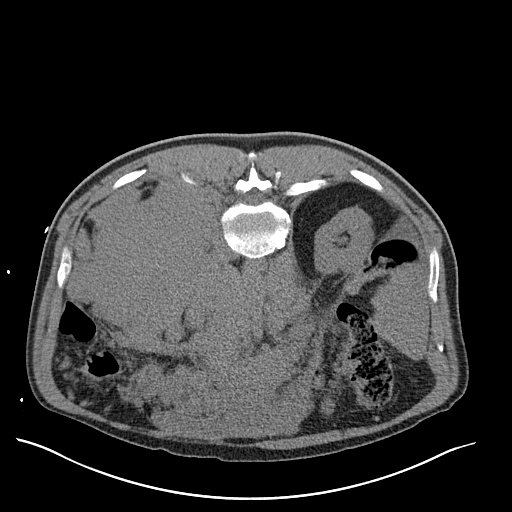
[im 53/79  soft-tissue]
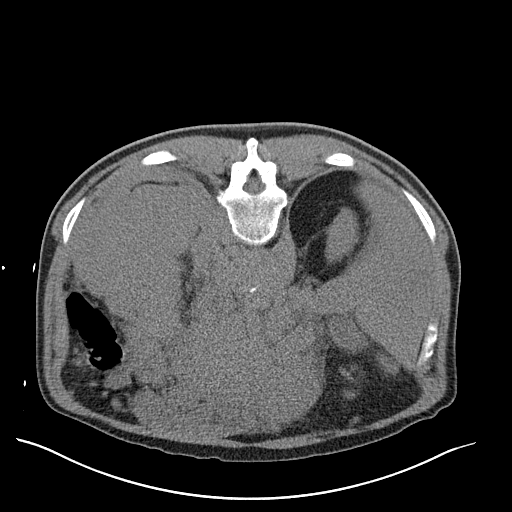
[im 53/79  bone]
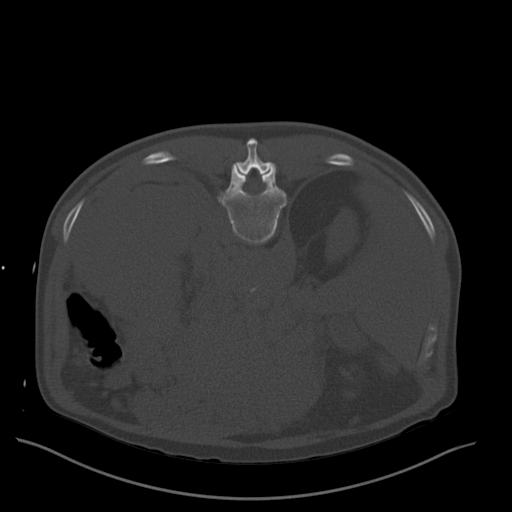
[im 58/79  soft-tissue]
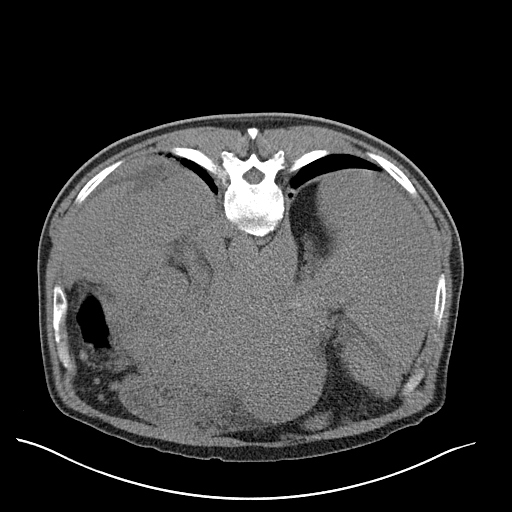
[im 64/79  soft-tissue]
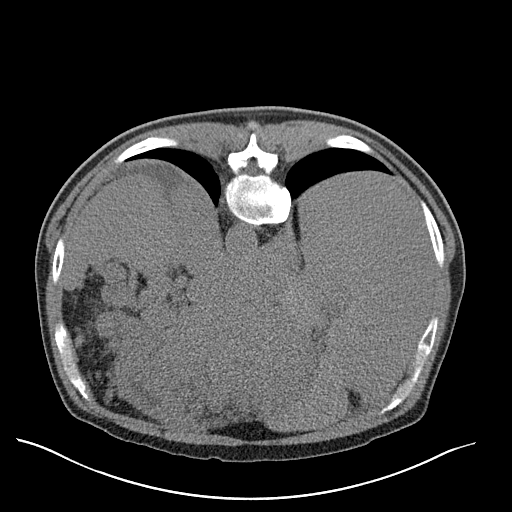
[im 67/79  lung]
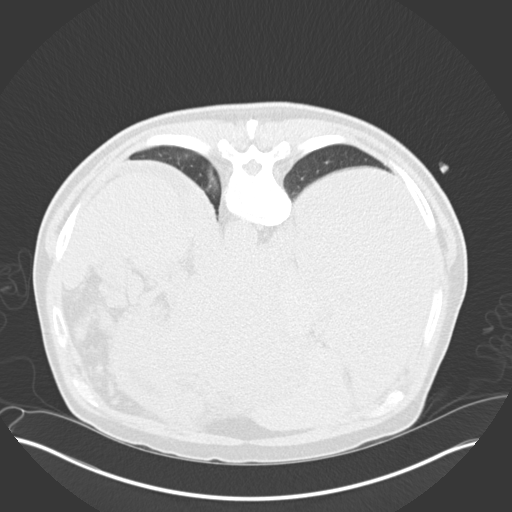
[im 70/79  soft-tissue]
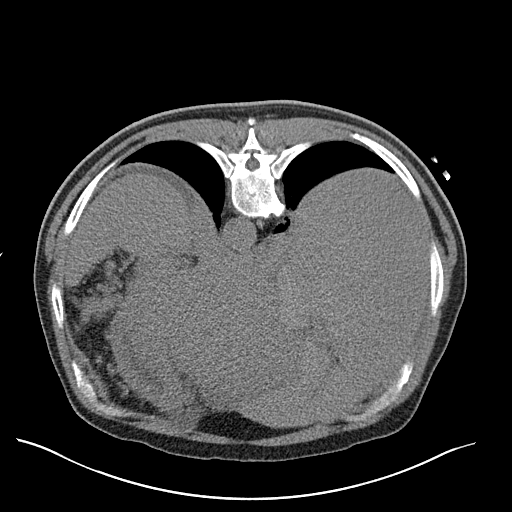
[im 70/79  lung]
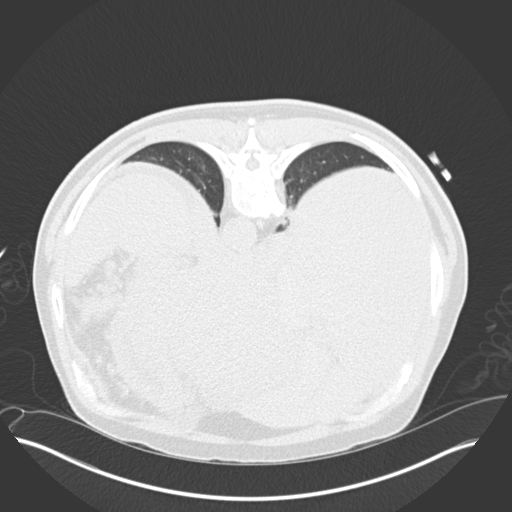
[im 73/79  lung]
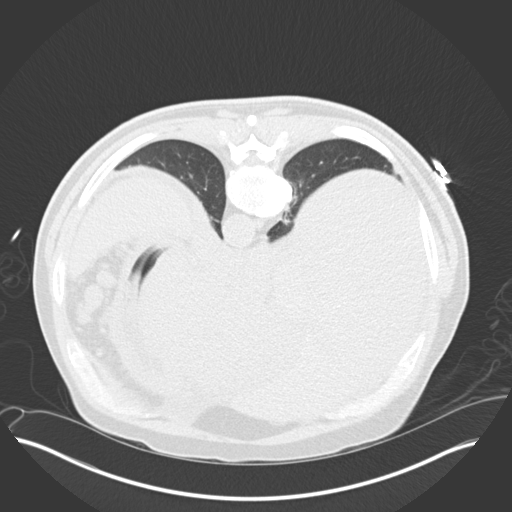
[im 76/79  soft-tissue]
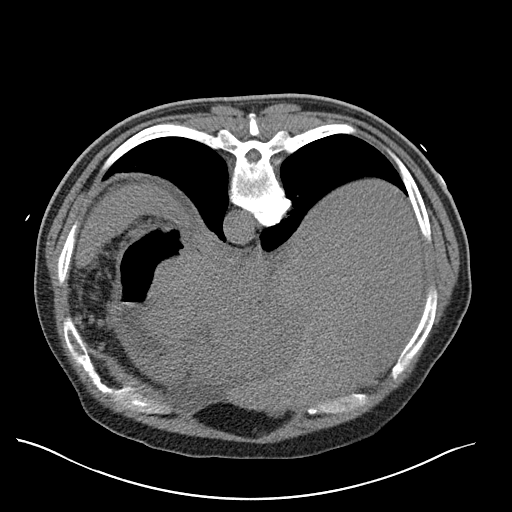
[im 76/79  lung]
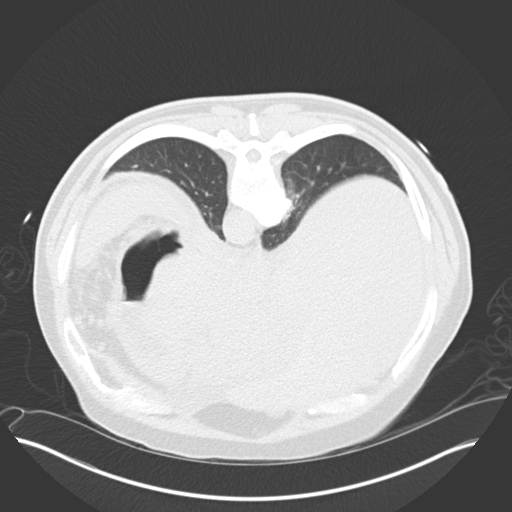

[15 of 32 positions shown; findings below may reference images not displayed]

EXAM:
CT BIOPSY

MEDICATIONS:
None

ANESTHESIA/SEDATION:
Fentanyl 50 mcg IV; Versed 1 mg IV

Moderate Sedation Time:  10

The patient was continuously monitored during the procedure by the
interventional radiology nurse under my direct supervision.

FLUOROSCOPY TIME:  None

COMPLICATIONS:
None immediate.

PROCEDURE:
Informed written consent was obtained from the patient after a
thorough discussion of the procedural risks, benefits and
alternatives. All questions were addressed. Maximal Sterile Barrier
Technique was utilized including caps, mask, sterile gowns, sterile
gloves, sterile drape, hand hygiene and skin antiseptic. A timeout
was performed prior to the initiation of the procedure.

Under CT guidance, a(n) 17 gauge guide needle was advanced into the
left retroperitoneal mass. Subsequently four 18 gauge core biopsies
were obtained. The guide needle was removed. Post biopsy images
demonstrate no hemorrhage.

Patient tolerated the procedure well without complication. Vital
sign monitoring by nursing staff during the procedure will continue
as patient is in the special procedures unit for post procedure
observation.
FINDINGS: The images document guide needle placement within the left
retroperitoneal mass. Post biopsy images demonstrate no hemorrhage.
IMPRESSION: Successful CT-guided core biopsy of a left retroperitoneal mass.
